# Patient Record
Sex: Female | Born: 1955 | Race: White | Hispanic: No | Smoking: Never smoker
Health system: Southern US, Community
[De-identification: ages and names within clinical notes are randomized; demographics above are authoritative.]

## PROBLEM LIST (undated history)

## (undated) DIAGNOSIS — S42201A Unspecified fracture of upper end of right humerus, initial encounter for closed fracture: Secondary | ICD-10-CM

## (undated) DIAGNOSIS — M1990 Unspecified osteoarthritis, unspecified site: Secondary | ICD-10-CM

## (undated) HISTORY — PX: TONSILLECTOMY: SUR1361

## (undated) HISTORY — PX: ROTATOR CUFF REPAIR: SHX139

---

## 2017-01-05 HISTORY — PX: ROTATOR CUFF REPAIR: SHX139

## 2019-09-25 ENCOUNTER — Emergency Department
Admission: EM | Admit: 2019-09-25 | Discharge: 2019-09-25 | Disposition: A | Discharge status: Home / Self Care | Payer: BC Managed Care – PPO | Attending: Emergency Medicine | Admitting: Emergency Medicine

## 2019-09-25 ENCOUNTER — Other Ambulatory Visit: Payer: Self-pay

## 2019-09-25 ENCOUNTER — Emergency Department: Payer: BC Managed Care – PPO

## 2019-09-25 DIAGNOSIS — Y92009 Unspecified place in unspecified non-institutional (private) residence as the place of occurrence of the external cause: Secondary | ICD-10-CM | POA: Diagnosis not present

## 2019-09-25 DIAGNOSIS — S4991XA Unspecified injury of right shoulder and upper arm, initial encounter: Secondary | ICD-10-CM | POA: Diagnosis present

## 2019-09-25 DIAGNOSIS — W19XXXA Unspecified fall, initial encounter: Secondary | ICD-10-CM | POA: Diagnosis not present

## 2019-09-25 DIAGNOSIS — S42351A Displaced comminuted fracture of shaft of humerus, right arm, initial encounter for closed fracture: Secondary | ICD-10-CM | POA: Diagnosis not present

## 2019-09-25 DIAGNOSIS — S42211A Unspecified displaced fracture of surgical neck of right humerus, initial encounter for closed fracture: Secondary | ICD-10-CM | POA: Diagnosis not present

## 2019-09-25 MED ORDER — MORPHINE SULFATE (PF) 4 MG/ML IV SOLN
4.0000 mg | Freq: Once | INTRAVENOUS | Status: AC
  Administered 2019-09-25: 4 mg via INTRAMUSCULAR
  Filled 2019-09-25: qty 1

## 2019-09-25 MED ORDER — CYCLOBENZAPRINE HCL 10 MG PO TABS
10.0000 mg | ORAL_TABLET | Freq: Once | ORAL | Status: AC
  Administered 2019-09-25: 10 mg via ORAL
  Filled 2019-09-25: qty 1

## 2019-09-25 MED ORDER — ONDANSETRON 4 MG PO TBDP
4.0000 mg | ORAL_TABLET | Freq: Once | ORAL | Status: AC
  Administered 2019-09-25: 4 mg via ORAL
  Filled 2019-09-25: qty 1

## 2019-09-25 MED ORDER — OXYCODONE-ACETAMINOPHEN 5-325 MG PO TABS: 1.0000 | ORAL_TABLET | Freq: Once | ORAL | Status: DC

## 2019-09-25 MED ORDER — OXYCODONE-ACETAMINOPHEN 5-325 MG PO TABS: 1.0000 | ORAL_TABLET | Freq: Three times a day (TID) | ORAL | 0 refills | Status: AC | PRN

## 2019-09-25 MED ORDER — OXYCODONE-ACETAMINOPHEN 5-325 MG PO TABS
1.0000 | ORAL_TABLET | Freq: Once | ORAL | Status: AC
  Administered 2019-09-25: 1 via ORAL
  Filled 2019-09-25: qty 1

## 2019-09-25 MED ORDER — CYCLOBENZAPRINE HCL 5 MG PO TABS: 5.0000 mg | ORAL_TABLET | Freq: Three times a day (TID) | ORAL | 0 refills | Status: DC | PRN

## 2019-09-25 MED ORDER — OXYCODONE HCL 5 MG PO TABS
5.0000 mg | ORAL_TABLET | Freq: Once | ORAL | Status: AC
  Administered 2019-09-25: 5 mg via ORAL
  Filled 2019-09-25: qty 1

## 2019-09-25 NOTE — Discharge Instructions (Signed)
Your exam and XR reveals a fracture of the humerus at the neck. Wear the brace until you are evaluated by Dr. Rosita Kea. Take the pain medicine as needed. Apply ice over the splint to reduce swelling.

## 2019-09-25 NOTE — ED Notes (Signed)
Ice applied to shoulder and elbow. Pt to xray.

## 2019-09-25 NOTE — ED Triage Notes (Signed)
Pt states she was knocked down by there dog and injured the right shoulder with obvious deformity. Pt also c/o Right upper leg pain but is able to ambulate to wheelchair. Denies hitting her head or other injures.

## 2019-09-25 NOTE — ED Provider Notes (Signed)
[facility] Emergency Department Provider Note ____________________________________________  Time seen: 1950  I have reviewed the triage vital signs and the nursing notes.  HISTORY  Chief Complaint  Fall  HPI Alicia Reese is a 64 y.o. female to the ED , accompanied by her adult daughter, for evaluation of injury sustained following mechanical fall.  Patient was knocked down by her dog, and fell injuring the right arm and shoulder.  She also reports some mild pain to the right upper leg.  She denies any head injury or loss of consciousness.  History reviewed. No pertinent past medical history.  There are no problems to display for this patient.   Past Surgical History:  Procedure Laterality Date  . ROTATOR CUFF REPAIR Right   . TONSILLECTOMY      Prior to Admission medications   Not on File    Allergies Aspirin  No family history on file.  Social History Social History   Tobacco Use  . Smoking status: Never Smoker  . Smokeless tobacco: Never Used  Substance Use Topics  . Alcohol use: Yes  . Drug use: Not Currently    Review of Systems  Constitutional: Negative for fever. Eyes: Negative for visual changes. ENT: Negative for sore throat. Cardiovascular: Negative for chest pain. Respiratory: Negative for shortness of breath. Musculoskeletal: Negative for back pain.  Reports right shoulder pain and disability as above. Skin: Negative for rash. Neurological: Negative for headaches, focal weakness or numbness. ____________________________________________  PHYSICAL EXAM:  VITAL SIGNS: ED Triage Vitals  Enc Vitals Group     BP 09/25/19 1956 (!) 179/81     Pulse Rate 09/25/19 1956 71     Resp 09/25/19 1956 20     Temp 09/25/19 1956 98.2 F (36.8 C)     Temp src --      SpO2 09/25/19 1956 96 %     Weight --      Height --      Head Circumference --      Peak Flow --      Pain Score 09/25/19 1903 10     Pain Loc --      Pain Edu? --       Excl. in GC? --     Constitutional: Alert and oriented. Well appearing and in no distress. Head: Normocephalic and atraumatic. Eyes: Conjunctivae are normal. Normal extraocular movements Neck: Supple. Normal ROM Cardiovascular: Normal rate, regular rhythm. Normal distal pulses. Respiratory: Normal respiratory effort. No wheezes/rales/rhonchi. Gastrointestinal: Soft and nontender. No distention. Musculoskeletal: Right shoulder with obvious deformity.  The right arm is held in a flexed abducted position and the shoulder.  Nontender with normal range of motion in all extremities.  Neurologic: Cranial nerves II to XII grossly intact.  Normal composite fist distally.. Normal speech and language. No gross focal neurologic deficits are appreciated. Skin:  Skin is warm, dry and intact. No rash noted. Psychiatric: Mood and affect are normal. Patient exhibits appropriate insight and judgment. ____________________________________________   RADIOLOGY  DG Right Shoulder IMPRESSION: 1. Comminuted fracture through the proximal right humerus. 2. Glenohumeral osteoarthritis. 3. Osteopenia.  DG Right Femur IMPRESSION: Negative.  DG Right Elbow IMPRESSION: No acute abnormality noted. ____________________________________________  PROCEDURES  Percocet 5-325 mg PO Zofran 4 mg ODT Morphine 4 mg IM Percocet 5-325 mg PO Oxycodone IR 5 mg PO  .Splint Application  Date/Time: 09/25/2019 10:15 PM Performed by: Lissa Hoard, PA-C Authorized by: Lissa Hoard, PA-C   Consent:  Consent obtained:  Verbal   Consent given by:  Patient   Risks discussed:  Pain Pre-procedure details:    Sensation:  Normal Procedure details:    Laterality:  Right   Location:  Shoulder   Shoulder:  R shoulder   Splint type:  Double sugar tong   Supplies:  Cotton padding, elastic bandage, Ortho-Glass and sling Post-procedure details:    Pain:  Improved   Sensation:  Normal   Patient  tolerance of procedure:  Tolerated well, no immediate complications  ____________________________________________  INITIAL IMPRESSION / ASSESSMENT AND PLAN / ED COURSE  Patient with ED evaluation and initial fracture care of a closed surgical neck fracture of the right humerus.  Patient sustained an injury after mechanical fall at home.  Images of the shoulder and elbow are reviewed.  She is placed in a double sugar tong to the upper arm and a sling is applied.  Pain is controlled at the time of this disposition.  Patient discharged to follow-up with Ortho as discussed. ----------------------------------------- 7:57 PM on 09/25/2019 ----------------------------------------- S/w Dr. Rosita Kea: he suggests splint/sling and outpatient follow-up in 1 week.    Alicia Reese was evaluated in Emergency Department on 09/25/2019 for the symptoms described in the history of present illness. She was evaluated in the context of the global COVID-19 pandemic, which necessitated consideration that the patient might be at risk for infection with the SARS-CoV-2 virus that causes COVID-19. Institutional protocols and algorithms that pertain to the evaluation of patients at risk for COVID-19 are in a state of rapid change based on information released by regulatory bodies including the CDC and federal and state organizations. These policies and algorithms were followed during the patient's care in the ED.  I reviewed the patient's prescription history over the last 12 months in the multi-state controlled substances database(s) that includes [place], [place], [place], [place], [place], [place], [place], [place], [place], [place], [place], [place], [place], and [place].  Results were notable for no RX history. ____________________________________________  FINAL CLINICAL IMPRESSION(S) / ED DIAGNOSES  Final diagnoses:  Fall in home, initial encounter  Closed displaced comminuted fracture  of shaft of right humerus, initial encounter  Closed displaced fracture of surgical neck of right humerus, unspecified fracture morphology, initial encounter      Kebron Pulse, Charlesetta Ivory, PA-C 09/25/19 2221    Merwyn Katos, MD 09/29/19 1154

## 2019-09-25 NOTE — ED Notes (Signed)
Pt states tripping over a dog. Pt states she did not hit her head or have LOC

## 2019-09-28 ENCOUNTER — Other Ambulatory Visit: Payer: Self-pay | Admitting: Sports Medicine

## 2019-09-28 ENCOUNTER — Ambulatory Visit
Admission: RE | Admit: 2019-09-28 | Discharge: 2019-09-28 | Disposition: A | Discharge status: Home / Self Care | Payer: BC Managed Care – PPO | Source: Ambulatory Visit | Attending: Sports Medicine | Admitting: Sports Medicine

## 2019-09-28 DIAGNOSIS — M898X2 Other specified disorders of bone, upper arm: Secondary | ICD-10-CM

## 2019-09-29 ENCOUNTER — Other Ambulatory Visit: Payer: Self-pay

## 2019-09-29 ENCOUNTER — Encounter (HOSPITAL_BASED_OUTPATIENT_CLINIC_OR_DEPARTMENT_OTHER): Payer: Self-pay | Admitting: Orthopaedic Surgery

## 2019-09-30 ENCOUNTER — Other Ambulatory Visit (HOSPITAL_COMMUNITY)
Admission: RE | Admit: 2019-09-30 | Discharge: 2019-09-30 | Disposition: A | Discharge status: Home / Self Care | Payer: BC Managed Care – PPO | Source: Ambulatory Visit | Attending: Orthopaedic Surgery | Admitting: Orthopaedic Surgery

## 2019-09-30 DIAGNOSIS — Z01812 Encounter for preprocedural laboratory examination: Secondary | ICD-10-CM | POA: Insufficient documentation

## 2019-09-30 DIAGNOSIS — Z20822 Contact with and (suspected) exposure to covid-19: Secondary | ICD-10-CM | POA: Diagnosis not present

## 2019-09-30 LAB — SARS CORONAVIRUS 2 (TAT 6-24 HRS): SARS Coronavirus 2: NEGATIVE

## 2019-10-02 ENCOUNTER — Encounter (HOSPITAL_BASED_OUTPATIENT_CLINIC_OR_DEPARTMENT_OTHER)
Admission: RE | Admit: 2019-10-02 | Discharge: 2019-10-02 | Disposition: A | Discharge status: Home / Self Care | Payer: BC Managed Care – PPO | Source: Ambulatory Visit | Attending: Orthopaedic Surgery | Admitting: Orthopaedic Surgery

## 2019-10-02 DIAGNOSIS — Z886 Allergy status to analgesic agent status: Secondary | ICD-10-CM | POA: Diagnosis not present

## 2019-10-02 DIAGNOSIS — Z01812 Encounter for preprocedural laboratory examination: Secondary | ICD-10-CM | POA: Diagnosis not present

## 2019-10-02 DIAGNOSIS — W1789XA Other fall from one level to another, initial encounter: Secondary | ICD-10-CM | POA: Diagnosis not present

## 2019-10-02 DIAGNOSIS — M19011 Primary osteoarthritis, right shoulder: Secondary | ICD-10-CM | POA: Diagnosis present

## 2019-10-02 DIAGNOSIS — S42291A Other displaced fracture of upper end of right humerus, initial encounter for closed fracture: Secondary | ICD-10-CM | POA: Diagnosis not present

## 2019-10-02 DIAGNOSIS — Z9889 Other specified postprocedural states: Secondary | ICD-10-CM | POA: Diagnosis not present

## 2019-10-02 DIAGNOSIS — M19032 Primary osteoarthritis, left wrist: Secondary | ICD-10-CM | POA: Diagnosis not present

## 2019-10-02 DIAGNOSIS — Y939 Activity, unspecified: Secondary | ICD-10-CM | POA: Diagnosis not present

## 2019-10-02 DIAGNOSIS — M19031 Primary osteoarthritis, right wrist: Secondary | ICD-10-CM | POA: Diagnosis not present

## 2019-10-02 DIAGNOSIS — M1990 Unspecified osteoarthritis, unspecified site: Secondary | ICD-10-CM | POA: Diagnosis not present

## 2019-10-02 LAB — SURGICAL PCR SCREEN
MRSA, PCR: NEGATIVE
Staphylococcus aureus: POSITIVE — AB

## 2019-10-02 NOTE — Progress Notes (Signed)
PCR swab - positive for Staph, notified Sherrie at Dr. Austin Miles office.

## 2019-10-02 NOTE — Progress Notes (Signed)
      Enhanced Recovery after Surgery for Orthopedics Enhanced Recovery after Surgery is a protocol used to improve the stress on your body and your recovery after surgery.  Patient Instructions  . The night before surgery:  o No food after midnight. ONLY clear liquids after midnight  . The day of surgery (if you do NOT have diabetes):  o Drink ONE (1) Pre-Surgery Clear Ensure as directed.   o This drink was given to you during your hospital  pre-op appointment visit. o The pre-op nurse will instruct you on the time to drink the  Pre-Surgery Ensure depending on your surgery time. o Finish the drink at the designated time by the pre-op nurse.  o Nothing else to drink after completing the  Pre-Surgery Clear Ensure.  . The day of surgery (if you have diabetes): o Drink ONE (1) Gatorade 2 (G2) as directed. o This drink was given to you during your hospital  pre-op appointment visit.  o The pre-op nurse will instruct you on the time to drink the   Gatorade 2 (G2) depending on your surgery time. o Color of the Gatorade may vary. Red is not allowed. o Nothing else to drink after completing the  Gatorade 2 (G2).         If you have questions, please contact your surgeon's office. Surgical soap given with instructions, pt verbalized understanding. Benzoyl peroxide gel given with instructions, pt verbalized understanding. 

## 2019-10-03 NOTE — H&P (Signed)
PREOPERATIVE H&P  Chief Complaint: RIGHT PROXIMAL HUMERUS FRACTURE  HPI: Alicia Reese is a 64 y.o. female who is scheduled for Procedure(s): RIGHT REVERSE TOTAL  SHOULDER ARTHROPLASTY.   Patient is a healthy 64 year old female who just moved from [place]. She had a fall on Monday night, September 20.   She fell off a porch.  She is right-hand dominant at baseline.  She already had previous rotator cuff surgery in 2019 and had some arthritis in the shoulder that she had known about. She has had trouble with her right shoulder prior to having this fall.   Her symptoms are rated as moderate to severe, and have been worsening.  This is significantly impairing activities of daily living.    Please see clinic note for further details on this patient's care.    She has elected for surgical management.   Past Medical History:  Diagnosis Date  . Arthritis    back, wrists, shoulders  . Closed fracture of right proximal humerus    Past Surgical History:  Procedure Laterality Date  . ROTATOR CUFF REPAIR Right 2019  . TONSILLECTOMY     Social History   Socioeconomic History  . Marital status: Widowed    Spouse name: Not on file  . Number of children: Not on file  . Years of education: Not on file  . Highest education level: Not on file  Occupational History  . Not on file  Tobacco Use  . Smoking status: Never Smoker  . Smokeless tobacco: Never Used  Substance and Sexual Activity  . Alcohol use: Yes    Comment: social  . Drug use: Never  . Sexual activity: Not on file  Other Topics Concern  . Not on file  Social History Narrative  . Not on file   Social Determinants of Health   Financial Resource Strain:   . Difficulty of Paying Living Expenses: Not on file  Food Insecurity:   . Worried About Programme researcher, broadcasting/film/video in the Last Year: Not on file  . Ran Out of Food in the Last Year: Not on file  Transportation Needs:   . Lack of Transportation (Medical): Not on file  .  Lack of Transportation (Non-Medical): Not on file  Physical Activity:   . Days of Exercise per Week: Not on file  . Minutes of Exercise per Session: Not on file  Stress:   . Feeling of Stress : Not on file  Social Connections:   . Frequency of Communication with Friends and Family: Not on file  . Frequency of Social Gatherings with Friends and Family: Not on file  . Attends Religious Services: Not on file  . Active Member of Clubs or Organizations: Not on file  . Attends Banker Meetings: Not on file  . Marital Status: Not on file   History reviewed. No pertinent family history. Allergies  Allergen Reactions  . Aspirin Anaphylaxis  . Naproxen Anaphylaxis  . Nsaids Anaphylaxis   Prior to Admission medications   Medication Sig Start Date End Date Taking? Authorizing Provider  cyclobenzaprine (FLEXERIL) 5 MG tablet Take 1 tablet (5 mg total) by mouth 3 (three) times daily as needed. 09/25/19  Yes Menshew, Charlesetta Ivory, PA-C    ROS: All other systems have been reviewed and were otherwise negative with the exception of those mentioned in the HPI and as above.  Physical Exam: General: Alert, no acute distress Cardiovascular: No pedal edema Respiratory: No cyanosis, no  use of accessory musculature GI: No organomegaly, abdomen is soft and non-tender Skin: No lesions in the area of chief complaint Neurologic: Sensation intact distally Psychiatric: Patient is competent for consent with normal mood and affect Lymphatic: No axillary or cervical lymphadenopathy  MUSCULOSKELETAL:  Examination of the right arm demonstrates exquisite pain with any attempts at range of motion.  Axillary nerve sensation appears to be altered compared to the contralateral side.  We are not able to get any firing of her axillary nerve secondary to pain.  Distal motor and sensory functions are otherwise intact.  Imaging: X-rays reviewed demonstrate a displaced two-part proximal humerus fracture in  the setting of moderate to severe glenohumeral arthritis.  Assessment: RIGHT PROXIMAL HUMERUS FRACTURE Previous cuff repair with arthritis and a proximal humerus fracture.  Also has questionable axillary nerve function currently.  Plan: Plan for Procedure(s): RIGHT REVERSE TOTAL  SHOULDER ARTHROPLASTY  Dr. Everardo Pacific and the patient talked about the possibility of doing a reverse total shoulder arthroplasty in the setting of her previous cuff failure and her arthritis.  She elected to proceed with this.  We are worried about her axillary nerve function.  We will continue to monitor this.  The risks benefits and alternatives were discussed with the patient including but not limited to the risks of nonoperative treatment, versus surgical intervention including infection, bleeding, nerve injury,  blood clots, cardiopulmonary complications, morbidity, mortality, among others, and they were willing to proceed.   We additionally specifically discussed risks of axillary nerve injury, infection, periprosthetic fracture, continued pain and longevity of implants prior to beginning procedure.    Patient will be closely monitored in PACU for medical stabilization and pain control. If found stable in PACU, patient may be discharged home with outpatient follow-up. If any concerns regarding patient's stabilization patient will be admitted for observation after surgery. The patient is planning to be discharged home with outpatient PT.   The patient acknowledged the explanation, agreed to proceed with the plan and consent was signed.   Operative Plan: Right reverse total shoulder arthroplasty  Discharge Medications: Standard DVT Prophylaxis: Aspirin Physical Therapy: Outpatient PT Special Discharge needs: Sling   Vernetta Honey, PA-C  10/03/2019 4:10 PM

## 2019-10-04 ENCOUNTER — Ambulatory Visit (HOSPITAL_COMMUNITY): Payer: BC Managed Care – PPO

## 2019-10-04 ENCOUNTER — Ambulatory Visit (HOSPITAL_BASED_OUTPATIENT_CLINIC_OR_DEPARTMENT_OTHER)
Admission: RE | Admit: 2019-10-04 | Discharge: 2019-10-04 | Disposition: A | Discharge status: Home / Self Care | Payer: BC Managed Care – PPO | Attending: Orthopaedic Surgery | Admitting: Orthopaedic Surgery

## 2019-10-04 ENCOUNTER — Ambulatory Visit (HOSPITAL_BASED_OUTPATIENT_CLINIC_OR_DEPARTMENT_OTHER): Payer: BC Managed Care – PPO | Admitting: Anesthesiology

## 2019-10-04 ENCOUNTER — Other Ambulatory Visit: Payer: Self-pay

## 2019-10-04 ENCOUNTER — Encounter (HOSPITAL_BASED_OUTPATIENT_CLINIC_OR_DEPARTMENT_OTHER)
Admission: RE | Disposition: A | Discharge status: Home / Self Care | Payer: Self-pay | Source: Home / Self Care | Attending: Orthopaedic Surgery

## 2019-10-04 ENCOUNTER — Encounter (HOSPITAL_BASED_OUTPATIENT_CLINIC_OR_DEPARTMENT_OTHER): Payer: Self-pay | Admitting: Orthopaedic Surgery

## 2019-10-04 DIAGNOSIS — S42291A Other displaced fracture of upper end of right humerus, initial encounter for closed fracture: Secondary | ICD-10-CM | POA: Diagnosis not present

## 2019-10-04 DIAGNOSIS — M19011 Primary osteoarthritis, right shoulder: Secondary | ICD-10-CM | POA: Insufficient documentation

## 2019-10-04 DIAGNOSIS — M19032 Primary osteoarthritis, left wrist: Secondary | ICD-10-CM | POA: Insufficient documentation

## 2019-10-04 DIAGNOSIS — M19031 Primary osteoarthritis, right wrist: Secondary | ICD-10-CM | POA: Insufficient documentation

## 2019-10-04 DIAGNOSIS — M1990 Unspecified osteoarthritis, unspecified site: Secondary | ICD-10-CM | POA: Insufficient documentation

## 2019-10-04 DIAGNOSIS — Z9889 Other specified postprocedural states: Secondary | ICD-10-CM | POA: Insufficient documentation

## 2019-10-04 DIAGNOSIS — Y939 Activity, unspecified: Secondary | ICD-10-CM | POA: Insufficient documentation

## 2019-10-04 DIAGNOSIS — Z09 Encounter for follow-up examination after completed treatment for conditions other than malignant neoplasm: Secondary | ICD-10-CM

## 2019-10-04 DIAGNOSIS — W1789XA Other fall from one level to another, initial encounter: Secondary | ICD-10-CM | POA: Insufficient documentation

## 2019-10-04 DIAGNOSIS — Z886 Allergy status to analgesic agent status: Secondary | ICD-10-CM | POA: Insufficient documentation

## 2019-10-04 HISTORY — DX: Unspecified osteoarthritis, unspecified site: M19.90

## 2019-10-04 HISTORY — PX: REVERSE SHOULDER ARTHROPLASTY: SHX5054

## 2019-10-04 HISTORY — DX: Unspecified fracture of upper end of right humerus, initial encounter for closed fracture: S42.201A

## 2019-10-04 SURGERY — ARTHROPLASTY, SHOULDER, TOTAL, REVERSE
Anesthesia: General | Site: Shoulder | Laterality: Right

## 2019-10-04 MED ORDER — FENTANYL CITRATE (PF) 100 MCG/2ML IJ SOLN
INTRAMUSCULAR | Status: AC
  Filled 2019-10-04: qty 2

## 2019-10-04 MED ORDER — MEPERIDINE HCL 25 MG/ML IJ SOLN: 6.2500 mg | INTRAMUSCULAR | Status: DC | PRN

## 2019-10-04 MED ORDER — ROCURONIUM BROMIDE 10 MG/ML (PF) SYRINGE
PREFILLED_SYRINGE | INTRAVENOUS | Status: DC | PRN
  Administered 2019-10-04: 60 mg via INTRAVENOUS

## 2019-10-04 MED ORDER — SODIUM CHLORIDE 0.9 % IV SOLN
INTRAVENOUS | Status: AC | PRN
  Administered 2019-10-04: 1000 mL

## 2019-10-04 MED ORDER — TRANEXAMIC ACID-NACL 1000-0.7 MG/100ML-% IV SOLN
INTRAVENOUS | Status: AC
  Filled 2019-10-04: qty 100

## 2019-10-04 MED ORDER — PHENYLEPHRINE 40 MCG/ML (10ML) SYRINGE FOR IV PUSH (FOR BLOOD PRESSURE SUPPORT)
PREFILLED_SYRINGE | INTRAVENOUS | Status: DC | PRN
  Administered 2019-10-04: 80 ug via INTRAVENOUS

## 2019-10-04 MED ORDER — FENTANYL CITRATE (PF) 100 MCG/2ML IJ SOLN
100.0000 ug | Freq: Once | INTRAMUSCULAR | Status: AC
  Administered 2019-10-04: 100 ug via INTRAVENOUS

## 2019-10-04 MED ORDER — TRANEXAMIC ACID-NACL 1000-0.7 MG/100ML-% IV SOLN
1000.0000 mg | INTRAVENOUS | Status: AC
  Administered 2019-10-04: 1000 mg via INTRAVENOUS

## 2019-10-04 MED ORDER — CEFAZOLIN SODIUM-DEXTROSE 2-4 GM/100ML-% IV SOLN
2.0000 g | INTRAVENOUS | Status: AC
  Administered 2019-10-04: 2 g via INTRAVENOUS

## 2019-10-04 MED ORDER — LACTATED RINGERS IV SOLN: INTRAVENOUS | Status: DC

## 2019-10-04 MED ORDER — ONDANSETRON HCL 4 MG/2ML IJ SOLN: 4.0000 mg | Freq: Once | INTRAMUSCULAR | Status: DC | PRN

## 2019-10-04 MED ORDER — PROPOFOL 10 MG/ML IV BOLUS
INTRAVENOUS | Status: DC | PRN
  Administered 2019-10-04: 180 mg via INTRAVENOUS

## 2019-10-04 MED ORDER — FENTANYL CITRATE (PF) 100 MCG/2ML IJ SOLN: 25.0000 ug | INTRAMUSCULAR | Status: DC | PRN

## 2019-10-04 MED ORDER — BUPIVACAINE-EPINEPHRINE (PF) 0.5% -1:200000 IJ SOLN
INTRAMUSCULAR | Status: DC | PRN
  Administered 2019-10-04: 15 mL via PERINEURAL

## 2019-10-04 MED ORDER — SUGAMMADEX SODIUM 200 MG/2ML IV SOLN
INTRAVENOUS | Status: DC | PRN
  Administered 2019-10-04: 200 mg via INTRAVENOUS

## 2019-10-04 MED ORDER — VANCOMYCIN HCL 1000 MG IV SOLR
INTRAVENOUS | Status: DC | PRN
  Administered 2019-10-04: 1000 mg

## 2019-10-04 MED ORDER — LIDOCAINE 2% (20 MG/ML) 5 ML SYRINGE
INTRAMUSCULAR | Status: DC | PRN
  Administered 2019-10-04: 60 mg via INTRAVENOUS

## 2019-10-04 MED ORDER — FENTANYL CITRATE (PF) 250 MCG/5ML IJ SOLN
INTRAMUSCULAR | Status: DC | PRN
Start: 2019-10-04 — End: 2019-10-04
  Administered 2019-10-04 (×2): 50 ug via INTRAVENOUS

## 2019-10-04 MED ORDER — RIVAROXABAN 10 MG PO TABS: 10.0000 mg | ORAL_TABLET | Freq: Every day | ORAL | 0 refills | Status: AC

## 2019-10-04 MED ORDER — ACETAMINOPHEN 325 MG PO TABS: 325.0000 mg | ORAL_TABLET | ORAL | Status: DC | PRN

## 2019-10-04 MED ORDER — CEFAZOLIN SODIUM-DEXTROSE 2-4 GM/100ML-% IV SOLN
INTRAVENOUS | Status: AC
  Filled 2019-10-04: qty 100

## 2019-10-04 MED ORDER — DEXAMETHASONE SODIUM PHOSPHATE 10 MG/ML IJ SOLN
INTRAMUSCULAR | Status: DC | PRN
  Administered 2019-10-04: 5 mg via INTRAVENOUS

## 2019-10-04 MED ORDER — OXYCODONE HCL 5 MG/5ML PO SOLN: 5.0000 mg | Freq: Once | ORAL | Status: DC | PRN

## 2019-10-04 MED ORDER — ACETAMINOPHEN 500 MG PO TABS: 1000.0000 mg | ORAL_TABLET | Freq: Three times a day (TID) | ORAL | 0 refills | Status: AC

## 2019-10-04 MED ORDER — OXYCODONE HCL 5 MG PO TABS: ORAL_TABLET | ORAL | 0 refills | Status: AC

## 2019-10-04 MED ORDER — MIDAZOLAM HCL 2 MG/2ML IJ SOLN
2.0000 mg | Freq: Once | INTRAMUSCULAR | Status: AC
  Administered 2019-10-04: 2 mg via INTRAVENOUS

## 2019-10-04 MED ORDER — ACETAMINOPHEN 160 MG/5ML PO SOLN: 325.0000 mg | ORAL | Status: DC | PRN

## 2019-10-04 MED ORDER — OXYCODONE HCL 5 MG PO TABS: 5.0000 mg | ORAL_TABLET | Freq: Once | ORAL | Status: DC | PRN

## 2019-10-04 MED ORDER — ONDANSETRON HCL 4 MG/2ML IJ SOLN
INTRAMUSCULAR | Status: DC | PRN
  Administered 2019-10-04: 4 mg via INTRAVENOUS

## 2019-10-04 MED ORDER — GABAPENTIN 100 MG PO CAPS: 100.0000 mg | ORAL_CAPSULE | Freq: Two times a day (BID) | ORAL | 0 refills | Status: AC

## 2019-10-04 MED ORDER — BUPIVACAINE LIPOSOME 1.3 % IJ SUSP
INTRAMUSCULAR | Status: DC | PRN
  Administered 2019-10-04: 10 mL via PERINEURAL

## 2019-10-04 MED ORDER — MIDAZOLAM HCL 2 MG/2ML IJ SOLN
INTRAMUSCULAR | Status: AC
  Filled 2019-10-04: qty 2

## 2019-10-04 MED ORDER — ONDANSETRON HCL 4 MG PO TABS: 4.0000 mg | ORAL_TABLET | Freq: Three times a day (TID) | ORAL | 1 refills | Status: AC | PRN

## 2019-10-04 SURGICAL SUPPLY — 89 items
AID PSTN UNV HD RSTRNT DISP (MISCELLANEOUS) ×1
APL PRP STRL LF DISP 70% ISPRP (MISCELLANEOUS) ×1
BASEPLATE GLENOID RSA 3X25 0D (Shoulder) ×3 IMPLANT
BIT DRILL 3.2 PERIPHERAL SCREW (BIT) ×3 IMPLANT
BLADE HEX COATED 2.75 (ELECTRODE) ×3 IMPLANT
BLADE SAW SAG 73X25 THK (BLADE) ×2
BLADE SAW SGTL 73X25 THK (BLADE) ×1 IMPLANT
BLADE SURG 10 STRL SS (BLADE) ×3 IMPLANT
BLADE SURG 15 STRL LF DISP TIS (BLADE) ×1 IMPLANT
BLADE SURG 15 STRL SS (BLADE) ×3
BNDG COHESIVE 4X5 TAN STRL (GAUZE/BANDAGES/DRESSINGS) IMPLANT
BODY PROXIMAL PTC 13X132.5 (Joint) ×1 IMPLANT
BSPLAT GLND +3 25 (Shoulder) ×1 IMPLANT
CAP LOCKING COCR (Cap) ×3 IMPLANT
CHLORAPREP W/TINT 26 (MISCELLANEOUS) ×3 IMPLANT
CLOSURE STERI-STRIP 1/2X4 (GAUZE/BANDAGES/DRESSINGS) ×1
CLSR STERI-STRIP ANTIMIC 1/2X4 (GAUZE/BANDAGES/DRESSINGS) ×2 IMPLANT
COOLER ICEMAN CLASSIC (MISCELLANEOUS) ×3 IMPLANT
COVER BACK TABLE 60X90IN (DRAPES) ×3 IMPLANT
COVER MAYO STAND STRL (DRAPES) ×3 IMPLANT
COVER WAND RF STERILE (DRAPES) IMPLANT
DECANTER SPIKE VIAL GLASS SM (MISCELLANEOUS) IMPLANT
DRAPE IMP U-DRAPE 54X76 (DRAPES) ×3 IMPLANT
DRAPE INCISE IOBAN 66X45 STRL (DRAPES) ×6 IMPLANT
DRAPE U-SHAPE 76X120 STRL (DRAPES) ×6 IMPLANT
DRSG AQUACEL AG ADV 3.5X 6 (GAUZE/BANDAGES/DRESSINGS) ×3 IMPLANT
ELECT BLADE 4.0 EZ CLEAN MEGAD (MISCELLANEOUS) ×3
ELECT REM PT RETURN 9FT ADLT (ELECTROSURGICAL) ×3
ELECTRODE BLDE 4.0 EZ CLN MEGD (MISCELLANEOUS) ×1 IMPLANT
ELECTRODE REM PT RTRN 9FT ADLT (ELECTROSURGICAL) ×1 IMPLANT
GAUZE XEROFORM 1X8 LF (GAUZE/BANDAGES/DRESSINGS) IMPLANT
GLENOSPHERE REV SHOULDER 36 (Joint) ×3 IMPLANT
GLOVE BIO SURGEON STRL SZ 6 (GLOVE) ×3 IMPLANT
GLOVE BIO SURGEON STRL SZ 6.5 (GLOVE) ×4 IMPLANT
GLOVE BIO SURGEONS STRL SZ 6.5 (GLOVE) ×2
GLOVE BIOGEL M 6.5 STRL (GLOVE) ×3 IMPLANT
GLOVE BIOGEL PI IND STRL 6.5 (GLOVE) ×3 IMPLANT
GLOVE BIOGEL PI IND STRL 7.0 (GLOVE) ×1 IMPLANT
GLOVE BIOGEL PI IND STRL 8 (GLOVE) ×1 IMPLANT
GLOVE BIOGEL PI INDICATOR 6.5 (GLOVE) ×6
GLOVE BIOGEL PI INDICATOR 7.0 (GLOVE) ×2
GLOVE BIOGEL PI INDICATOR 8 (GLOVE) ×2
GLOVE ECLIPSE 8.0 STRL XLNG CF (GLOVE) ×6 IMPLANT
GOWN STRL REUS W/ TWL LRG LVL3 (GOWN DISPOSABLE) ×2 IMPLANT
GOWN STRL REUS W/TWL LRG LVL3 (GOWN DISPOSABLE) ×6
GOWN STRL REUS W/TWL XL LVL3 (GOWN DISPOSABLE) ×3 IMPLANT
GUIDEWIRE GLENOID 2.5X220 (WIRE) ×3 IMPLANT
HANDPIECE INTERPULSE COAX TIP (DISPOSABLE) ×3
IMPL REVERSE SHOULDER 0X3.5 (Shoulder) ×1 IMPLANT
IMPLANT REVERSE SHOULDER 0X3.5 (Shoulder) ×3 IMPLANT
INSERT HUMERAL 36X6MM 12.5DEG (Insert) ×3 IMPLANT
KIT STABILIZATION SHOULDER (MISCELLANEOUS) ×3 IMPLANT
MANIFOLD NEPTUNE II (INSTRUMENTS) ×3 IMPLANT
NDL SUT 6 .5 CRC .975X.05 MAYO (NEEDLE) ×1 IMPLANT
NEEDLE MAYO TAPER (NEEDLE) ×3
NEEDLE MAYO TROCAR (NEEDLE) ×3 IMPLANT
PACK ARTHROSCOPY DSU (CUSTOM PROCEDURE TRAY) ×3 IMPLANT
PACK BASIN DAY SURGERY FS (CUSTOM PROCEDURE TRAY) ×3 IMPLANT
PAD COLD SHLDR WRAP-ON (PAD) ×3 IMPLANT
PAD ORTHO SHOULDER 7X19 LRG (SOFTGOODS) ×3 IMPLANT
PENCIL SMOKE EVACUATOR (MISCELLANEOUS) ×3 IMPLANT
PROXIMAL BODY PTC 13X132.5 (Joint) ×3 IMPLANT
PRT COAT DISTL STEM 13 SHOUL (Miscellaneous) ×3 IMPLANT
RESTRAINT HEAD UNIVERSAL NS (MISCELLANEOUS) ×3 IMPLANT
SCREW BONE 6.5X40 SM (Screw) ×3 IMPLANT
SCREW PERIPHERAL 30 (Screw) ×3 IMPLANT
SCREW PERIPHERAL 5.0X34 (Screw) ×3 IMPLANT
SCREW SHLD ASSEMBLY AEQ 20 (Spacer) ×2 IMPLANT
SET HNDPC FAN SPRY TIP SCT (DISPOSABLE) ×1 IMPLANT
SHEET MEDIUM DRAPE 40X70 STRL (DRAPES) ×3 IMPLANT
SLEEVE SCD COMPRESS KNEE MED (MISCELLANEOUS) ×3 IMPLANT
SPACER AEQUALIS 13X20 (Spacer) ×1 IMPLANT
SPACER REV AEQUALIS 11 (Spacer) ×1 IMPLANT
SPACER SHLD CMT AEQ 13X20 (Spacer) ×2 IMPLANT
SPONGE LAP 18X18 RF (DISPOSABLE) ×3 IMPLANT
STEM SHLDR DIST PRTLY COATD 13 (Miscellaneous) ×1 IMPLANT
SUT ETHIBOND 2 V 37 (SUTURE) ×3 IMPLANT
SUT FIBERWIRE #5 38 CONV NDL (SUTURE) ×18
SUT MNCRL AB 4-0 PS2 18 (SUTURE) ×3 IMPLANT
SUT VIC AB 0 CT1 27 (SUTURE)
SUT VIC AB 0 CT1 27XCR 8 STRN (SUTURE) IMPLANT
SUT VIC AB 3-0 SH 27 (SUTURE) ×6
SUT VIC AB 3-0 SH 27X BRD (SUTURE) ×2 IMPLANT
SUTURE FIBERWR #5 38 CONV NDL (SUTURE) ×6 IMPLANT
SYR BULB IRRIG 60ML STRL (SYRINGE) IMPLANT
SYR TOOMEY IRRIG 70ML (MISCELLANEOUS) ×3
SYRINGE TOOMEY IRRIG 70ML (MISCELLANEOUS) ×1 IMPLANT
TOWEL GREEN STERILE FF (TOWEL DISPOSABLE) ×9 IMPLANT
TUBE SUCTION HIGH CAP CLEAR NV (SUCTIONS) ×3 IMPLANT

## 2019-10-04 NOTE — Progress Notes (Signed)
Assisted Dr. Oddono with right, ultrasound guided, interscalene  block. Side rails up, monitors on throughout procedure. See vital signs in flow sheet. Tolerated Procedure well. 

## 2019-10-04 NOTE — Op Note (Signed)
Orthopaedic Surgery Operative Note (CSN: [id number])  Alicia Reese  11/30/1955 Date of Surgery: 10/04/2019   Diagnoses:  Right proximal humerus fracture in the setting of previous cuff repair and signs of cuff tear arthropathy  Procedure: Right reverse total Shoulder Arthroplasty   Operative Finding Successful completion of planned procedure.  Patient had a superior cuff retear but anchors and fiber tape in place.  She had significant superior wear on her humeral head consistent with early proximal migration.  Glenoid was relatively intact.  We had robust fixation of the glenoid and used a longstem diaphyseal fit implant to get past her fracture site which encompassed the calcar.  Axillary nerve tug test was intact during and at the end of the case.  She did have a preoperative axillary nerve sensory abnormality at the very least and we were not able to get her to fire her deltoid.  She is a high risk of long-term issues as well as dislocation secondary to this.  Post-operative plan: The patient will be NWB in sling.  The patient will be will be discharged from PACU if continues to be stable as was plan prior to surgery.  DVT prophylaxis Xarelto 10 mg/day.  Pain control with PRN pain medication preferring oral medicines.  Follow up plan will be scheduled in approximately 7 days for incision check and XR.  Physical therapy to start after first visit.  Implants: Tornier 13 revive stem, 20 spacer, 13 proximal body, 0 high offset tray with 6 poly-.  36 glenosphere with a +3 baseplate size 25 mm and a 40 center screw.  Post-Op Diagnosis: Same Surgeons:Primary: Hiram Gash, MD Assistants:Caroline McBane PA-C Location: [facility] OR ROOM 5 Anesthesia: General with Exparel Interscalene Antibiotics: Ancef 2g preop, Vancomycin $RemoveBeforeDEI'1000mg'[id number]$  locally Tourniquet time: None Estimated Blood Loss: 130 Complications: None Specimens: None Implants: Implant Name Type Inv. Item Serial No. Manufacturer Lot No. LRB No. Used  Action  Lateralized Baseplate    [employer] [id number] Right 1 Implanted  GLENOSPHERE REV SHOULDER 36 - [id number] Joint GLENOSPHERE REV SHOULDER 36  [employer] [id number] Right 1 Implanted  SCREW BONE 6.5X40 SM - [id number] Screw SCREW BONE 6.5X40 SM  [employer] STERILIZED ON SET Right 1 Implanted  SCREW PERIPHERAL 5.0X34 - [id number] Screw SCREW PERIPHERAL 5.0X34  [employer] STERILIZED ON SET Right 1 Implanted  SCREW PERIPHERAL 30 - [id number] Screw SCREW PERIPHERAL 30  [employer] STERILIZED ON SET Right 1 Implanted  IMPLANT REVERSE SHOULDER 0X3.5 - [id number] Shoulder IMPLANT REVERSE SHOULDER 0X3.[place] [id number] Right 1 Implanted  INSERT HUMERAL 36X6MM 12.5DEG - [id number] Insert INSERT HUMERAL 36X6MM 12.[employer] [id number] Right 1 Implanted  Spacer    [employer] [id number] Right 1 Implanted  Locking Cap    [employer] [id number] Right 1 Implanted  Assembly Screw    [employer] [id number] Right 1 Implanted  PTC Proximal Body    [employer] [id number] Right 1 Implanted  PTC Coated Stem    [employer] [id number] Right 1 Implanted    Indications for Surgery:   Alicia Reese is a 64 y.o. 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  We talked about the possibility of fixing the fracture and coming back at a later date to do a replacement versus doing an arthroplasty now.  She did have a preoperative axillary nerve sensory deficit and possible palsy with inability to fire deltoid.  Benefits and risks of operative and nonoperative management were discussed prior to surgery with patient/guardian(s) and informed consent form was completed.  Infection and need for further surgery were discussed as was prosthetic stability and cuff issues.  We additionally specifically discussed risks of axillary nerve injury, infection, periprosthetic fracture, continued pain and  longevity of implants prior to beginning procedure.      Procedure:   The patient was identified in the preoperative holding area where the surgical site was marked. Block placed by anesthesia with exparel.  The patient was taken to the OR where a procedural timeout was called and the above noted anesthesia was induced.  The patient was positioned beachchair on allen table with spider arm positioner.  Preoperative antibiotics were dosed.  The patient's right shoulder was prepped and draped in the usual sterile fashion.  A second preoperative timeout was called.       Standard deltopectoral approach was performed with a #10 blade. We dissected down to the subcutaneous tissues and the cephalic vein was taken laterally with the deltoid. Clavipectoral fascia was incised in line with the incision. Deep retractors were placed. The long of the biceps tendon was identified and there was significant tenosynovitis present.  Tenodesis was performed to the pectoralis tendon with #2 Ethibond. The remaining biceps was followed up into the rotator interval where it was released.    We used the bicipital groove as a landmark for the lesser and greater tuberosity fragments.  We were able to mobilize the lesser tuberosity fragment and placed stay sutures in the bone tendon junction to help with mobilization.  This point we were able to identify the  greater tuberosity fragment and 4 #5  FiberWire sutures were used to place into this for eventual repair of the tuberosities.  Once these were both mobilized we took care to identify the shaft fragment as well as the head fragment.  We carefully identified the head fragment were able to manually remove it.  At this point the axillary nerve was found and palpated and with a tug test noted to be intact.  Protected throughout the remainder of the case with blunt retractors.    We then released the SGHL with bovie cautery prior to placing a curved mayo at the junction of the  anterior glenoid well above the axillary nerve and bluntly dissecting the subscapularis from the capsule.  We then carefully protected the axillary nerve as we gently released the inferior capsule to fully mobilize the subscapularis.  An anterior deltoid retractor was then placed as well as a small Hohmann retractor superiorly.   The glenoid was relatively preserved as we would expect in this fracture patient.  The remaining labrum was removed circumferentially taking great care not to disrupt the posterior capsule.    The glenoid drill guide was placed and used to drill a guide pin in the center, inferior position. The glenoid face was then reamed concentrically over the guide wire. The center hole was drilled over the guidepin in a near anatomic angle of version. Next the glenoid vault was drilled back to a depth of 40 mm.  We tapped and then placed a 5m size baseplate with 3 lateralization was selected with a 6.5 mm x 433mlength central screw.  The base plate was screwed into the glenoid vault obtaining secure fixation. We next placed superior and inferior locking screws for additional fixation.  Next a 36 mm glenosphere was selected and impacted onto the baseplate. The center screw was  tightened.   We then repositioned the arm to give access to the humeral shaft fragment.  Drill holes were placed and fiberwire sutures in the shaft for vertical fixation of the tuberosities.  We broached with the Revive stem implants  starting with a size 9 reamer and reaming up to 13 which obtained an appropriate fit.  We did use a 20 mm spacer to accommodate for length.  The proximal body was sized separately at 13 mm and attached to trial and achieve a stable articulation.    We trialed with multiple size tray and polyethylene options and selected a 0 high which provided good stability and range of motion without excess soft tissue tension. The offset was dialed in to match the normal anatomy. The shoulder was  trialed.  There was good ROM in all planes and the shoulder was stable with no inferior translation.   We then mobilized her tuberosities again and placed the anterior deep limbs of the 5 #5 fiber wires around the stem.  1 of these was tied down fixing the greater tuberosity in place after bone graft harvest from the humeral head component was placed underneath.  A +0 high offset tray was selected and impacted onto the stem.   A 36+6 polyethylene liner was impacted onto the stem.  The joint was reduced and thoroughly irrigated with pulsatile lavage. The remaining sutures were then placed through the subscapularis and the bone tendon junction and the tuberosities were reduced after bone graft placed beneath as autograft at the subscap.  We horizontally secured the tuberosities before placing vertical fixation with the suture that was placed into the shaft.  Tuberosities moved as a unit were happy with her overall reduction.  This was checked on fluoroscopy confirming our position.  We irrigated copiously at this point.  Hemostasis was obtained. The deltopectoral interval was reapproximated with #1 Ethibond. The subcutaneous tissues were closed with 3-0 Vicryl and the skin was closed with running monocryl.     The wounds were cleaned and dried and an Aquacel dressing was placed. The drapes taken down. The arm was placed into sling with abduction pillow. Patient was awakened, extubated, and transferred to the recovery room in stable condition. There were no intraoperative complications. The sponge, needle, and attention counts were correct at the end of the case.     Noemi Chapel, PA-C, present and scrubbed throughout the case, critical for completion in a timely fashion, and for retraction, instrumentation, closure.

## 2019-10-04 NOTE — Anesthesia Preprocedure Evaluation (Addendum)
Anesthesia Evaluation  Patient identified by MRN, date of birth, ID band Patient awake    Reviewed: Allergy & Precautions, NPO status , Patient's Chart, lab work & pertinent test results  Airway Mallampati: II  TM Distance: >3 FB Neck ROM: Full    Dental no notable dental hx. (+) Teeth Intact, Dental Advisory Given   Pulmonary neg pulmonary ROS,    Pulmonary exam normal breath sounds clear to auscultation       Cardiovascular negative cardio ROS Normal cardiovascular exam Rhythm:Regular Rate:Normal     Neuro/Psych negative neurological ROS  negative psych ROS   GI/Hepatic negative GI ROS, Neg liver ROS,   Endo/Other  negative endocrine ROS  Renal/GU negative Renal ROS  negative genitourinary   Musculoskeletal  (+) Arthritis , Osteoarthritis,    Abdominal   Peds negative pediatric ROS (+)  Hematology negative hematology ROS (+)   Anesthesia Other Findings   Reproductive/Obstetrics negative OB ROS                            Anesthesia Physical Anesthesia Plan  ASA: II  Anesthesia Plan: General   Post-op Pain Management: GA combined w/ Regional for post-op pain   Induction:   PONV Risk Score and Plan:   Airway Management Planned: Oral ETT and LMA  Additional Equipment:   Intra-op Plan:   Post-operative Plan: Extubation in OR  Informed Consent: I have reviewed the patients History and Physical, chart, labs and discussed the procedure including the risks, benefits and alternatives for the proposed anesthesia with the patient or authorized representative who has indicated his/her understanding and acceptance.     Dental advisory given  Plan Discussed with: Anesthesiologist and CRNA  Anesthesia Plan Comments: (Discussed both nerve block for pain relief post-op and GA; including NV, sore throat, dental injury, and pulmonary complications)        Anesthesia Quick  Evaluation

## 2019-10-04 NOTE — Discharge Instructions (Signed)
Post Anesthesia Home Care Instructions  Activity: Get plenty of rest for the remainder of the day. A responsible individual must stay with you for 24 hours following the procedure.  For the next 24 hours, DO NOT: -Drive a car -Operate machinery -Drink alcoholic beverages -Take any medication unless instructed by your physician -Make any legal decisions or sign important papers.  Meals: Start with liquid foods such as gelatin or soup. Progress to regular foods as tolerated. Avoid greasy, spicy, heavy foods. If nausea and/or vomiting occur, drink only clear liquids until the nausea and/or vomiting subsides. Call your physician if vomiting continues.  Special Instructions/Symptoms: Your throat may feel dry or sore from the anesthesia or the breathing tube placed in your throat during surgery. If this causes discomfort, gargle with warm salt water. The discomfort should disappear within 24 hours.  If you had a scopolamine patch placed behind your ear for the management of post- operative nausea and/or vomiting:  1. The medication in the patch is effective for 72 hours, after which it should be removed.  Wrap patch in a tissue and discard in the trash. Wash hands thoroughly with soap and water. 2. You may remove the patch earlier than 72 hours if you experience unpleasant side effects which may include dry mouth, dizziness or visual disturbances. 3. Avoid touching the patch. Wash your hands with soap and water after contact with the patch.      Regional Anesthesia Blocks  1. Numbness or the inability to move the "blocked" extremity may last from 3-48 hours after placement. The length of time depends on the medication injected and your individual response to the medication. If the numbness is not going away after 48 hours, call your surgeon.  2. The extremity that is blocked will need to be protected until the numbness is gone and the  Strength has returned. Because you cannot feel it, you  will need to take extra care to avoid injury. Because it may be weak, you may have difficulty moving it or using it. You may not know what position it is in without looking at it while the block is in effect.  3. For blocks in the legs and feet, returning to weight bearing and walking needs to be done carefully. You will need to wait until the numbness is entirely gone and the strength has returned. You should be able to move your leg and foot normally before you try and bear weight or walk. You will need someone to be with you when you first try to ensure you do not fall and possibly risk injury.  4. Bruising and tenderness at the needle site are common side effects and will resolve in a few days.  5. Persistent numbness or new problems with movement should be communicated to the surgeon or the [facility] ([phone])/ [facility] ([phone]).  Information for Discharge Teaching: EXPAREL (bupivacaine liposome injectable suspension)   Your surgeon or anesthesiologist gave you EXPAREL(bupivacaine) to help control your pain after surgery.   EXPAREL is a local anesthetic that provides pain relief by numbing the tissue around the surgical site.  EXPAREL is designed to release pain medication over time and can control pain for up to 72 hours.  Depending on how you respond to EXPAREL, you may require less pain medication during your recovery.  Possible side effects:  Temporary loss of sensation or ability to move in the area where bupivacaine was injected.  Nausea, vomiting, constipation  Rarely,   numbness and tingling in your mouth or lips, lightheadedness, or anxiety may occur.  Call your doctor right away if you think you may be experiencing any of these sensations, or if you have other questions regarding possible side effects.  Follow all other discharge instructions given to you by your surgeon or nurse. Eat a healthy diet and drink plenty of water or other  fluids.  If you return to the hospital for any reason within 96 hours following the administration of EXPAREL, it is important for health care providers to know that you have received this anesthetic. A teal colored band has been placed on your arm with the date, time and amount of EXPAREL you have received in order to alert and inform your health care providers. Please leave this armband in place for the full 96 hours following administration, and then you may remove the band. 

## 2019-10-04 NOTE — Interval H&P Note (Signed)
All questions answered

## 2019-10-04 NOTE — Anesthesia Postprocedure Evaluation (Signed)
Anesthesia Post Note  Patient: Alicia Reese  Procedure(s) Performed: RIGHT REVERSE TOTAL  SHOULDER ARTHROPLASTY (Right Shoulder)     Patient location during evaluation: PACU Anesthesia Type: General Level of consciousness: awake and alert Pain management: pain level controlled Vital Signs Assessment: post-procedure vital signs reviewed and stable Respiratory status: spontaneous breathing, nonlabored ventilation, respiratory function stable and patient connected to nasal cannula oxygen Cardiovascular status: blood pressure returned to baseline and stable Postop Assessment: no apparent nausea or vomiting Anesthetic complications: no   No complications documented.  Last Vitals:  Vitals:   10/04/19 1145 10/04/19 1202  BP: 118/73 109/73  Pulse: 92 (!) 103  Resp: 19 16  Temp:  37.8 C  SpO2: 99% 95%    Last Pain:  Vitals:   10/04/19 1202  TempSrc:   PainSc: 0-No pain                 Satonya Lux

## 2019-10-04 NOTE — Anesthesia Procedure Notes (Signed)
Anesthesia Regional Block: Interscalene brachial plexus block   Pre-Anesthetic Checklist: ,, timeout performed, Correct Patient, Correct Site, Correct Laterality, Correct Procedure, Correct Position, site marked, Risks and benefits discussed,  Surgical consent,  Pre-op evaluation,  At surgeon's request and post-op pain management  Laterality: Right  Prep: chloraprep       Needles:  Injection technique: Single-shot  Needle Type: Echogenic Stimulator Needle     Needle Length: 5cm  Needle Gauge: 22     Additional Needles:   Procedures:, nerve stimulator,,, ultrasound used (permanent image in chart),,,,   Nerve Stimulator or Paresthesia:  Response: hand, 0.45 mA,   Additional Responses:   Narrative:  Start time: 10/04/2019 8:46 AM End time: 10/04/2019 8:52 AM Injection made incrementally with aspirations every 5 mL.  Performed by: Personally  Anesthesiologist: Bethena Midget, MD  Additional Notes: Functioning IV was confirmed and monitors were applied.  A 19mm 22ga Arrow echogenic stimulator needle was used. Sterile prep and drape,hand hygiene and sterile gloves were used. Ultrasound guidance: relevant anatomy identified, needle position confirmed, local anesthetic spread visualized around nerve(s)., vascular puncture avoided.  Image printed for medical record. Negative aspiration and negative test dose prior to incremental administration of local anesthetic. The patient tolerated the procedure well.

## 2019-10-04 NOTE — Anesthesia Procedure Notes (Signed)
Procedure Name: Intubation Date/Time: 10/04/2019 9:18 AM Performed by: [place] Trampas Stettner C, CRNA Pre-anesthesia Checklist: Patient identified, Emergency Drugs available, Suction available and Patient being monitored Patient Re-evaluated:Patient Re-evaluated prior to induction Oxygen Delivery Method: Circle system utilized Preoxygenation: Pre-oxygenation with 100% oxygen Induction Type: IV induction Ventilation: Mask ventilation without difficulty Laryngoscope Size: Mac and 3 Grade View: Grade II Tube type: Oral Tube size: 7.0 mm Number of attempts: 1 Airway Equipment and Method: Stylet and Oral airway Placement Confirmation: ETT inserted through vocal cords under direct vision,  positive ETCO2 and breath sounds checked- equal and bilateral Tube secured with: Tape Dental Injury: Teeth and Oropharynx as per pre-operative assessment

## 2019-10-04 NOTE — Transfer of Care (Signed)
Immediate Anesthesia Transfer of Care Note  Patient: Alicia Reese  Procedure(s) Performed: RIGHT REVERSE TOTAL  SHOULDER ARTHROPLASTY (Right Shoulder)  Patient Location: PACU  Anesthesia Type:GA combined with regional for post-op pain  Level of Consciousness: awake, alert  and oriented  Airway & Oxygen Therapy: Patient Spontanous Breathing and Patient connected to nasal cannula oxygen  Post-op Assessment: Report given to RN and Post -op Vital signs reviewed and stable  Post vital signs: stable  Last Vitals:  Vitals Value Taken Time  BP 132/62 10/04/19 1112  Temp    Pulse 96 10/04/19 1113  Resp 16 10/04/19 1113  SpO2 100 % 10/04/19 1113  Vitals shown include unvalidated device data.  Last Pain:  Vitals:   10/04/19 0825  TempSrc: Oral  PainSc: 8          Complications: No complications documented.

## 2019-10-05 ENCOUNTER — Encounter (HOSPITAL_BASED_OUTPATIENT_CLINIC_OR_DEPARTMENT_OTHER): Payer: Self-pay | Admitting: Orthopaedic Surgery

## 2020-12-29 IMAGING — CT CT HUMERUS*R* W/O CM
3 series · 10 of 34 positions shown, 12 images · non-contrast
Comparison: None.

CLINICAL DATA: Proximal humeral fracture status post fall
09/25/2019.

EXAM:
CT OF THE RIGHT HUMERUS WITHOUT CONTRAST
CT - 3-DIMENSIONAL CT IMAGE RENDERING ON INDEPENDENT WORKSTATION
TECHNIQUE: Multidetector CT imaging was performed according to the standard
protocol. Multiplanar CT image reconstructions were also generated.

[Series 4: shoulder 2.00 br40 s3 axial soft · axial · 0.39mm/px · z∈[-961,-799]mm · 2 of 189 slices shown, 3 images]
[im 58/189  soft-tissue]
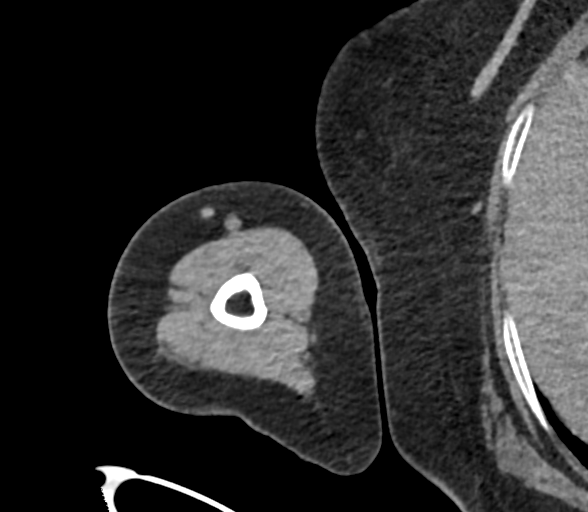
[im 58/189  bone]
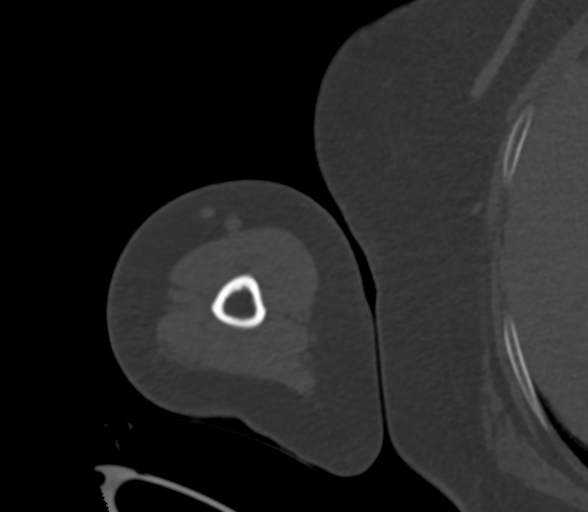
[im 145/189  bone]
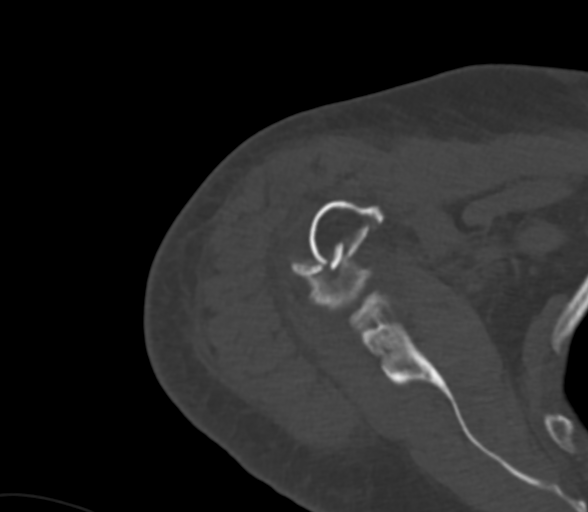

[Series 8: shoulder 2.00 br40 s3 cor soft · coronal · 0.45mm/px · 3 of 99 slices shown]
[im 20/99  bone]
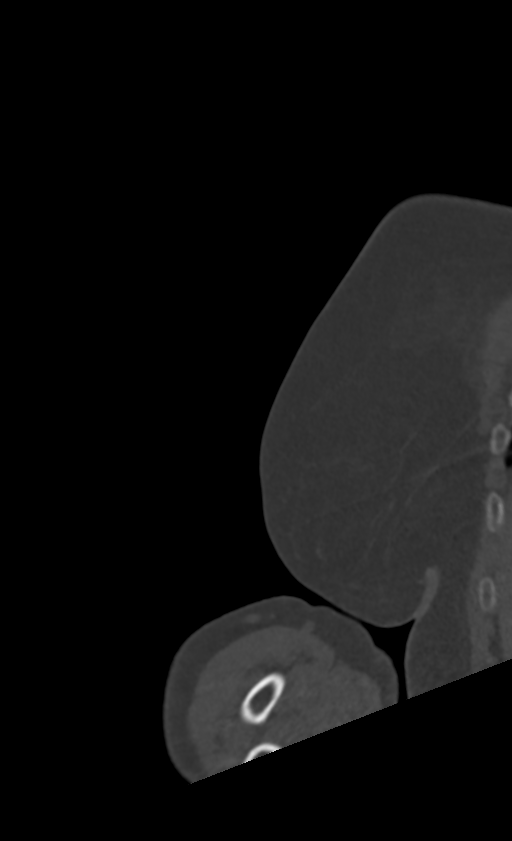
[im 40/99  bone]
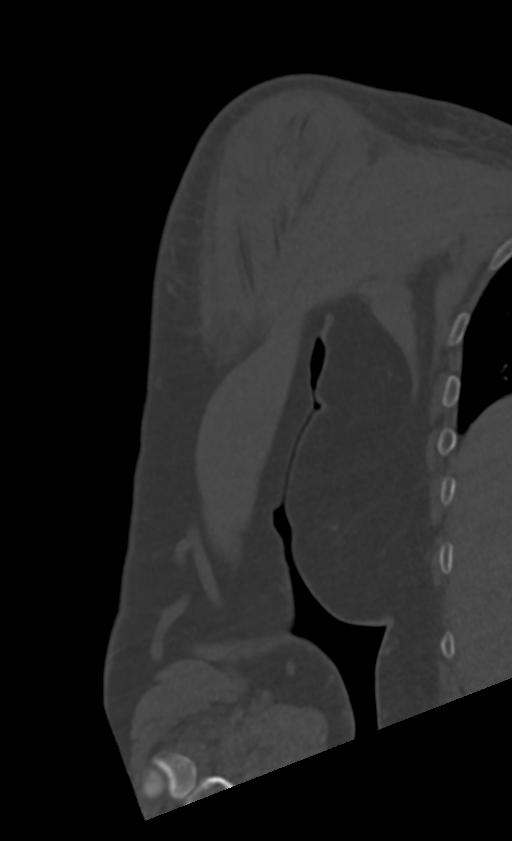
[im 59/99  bone]
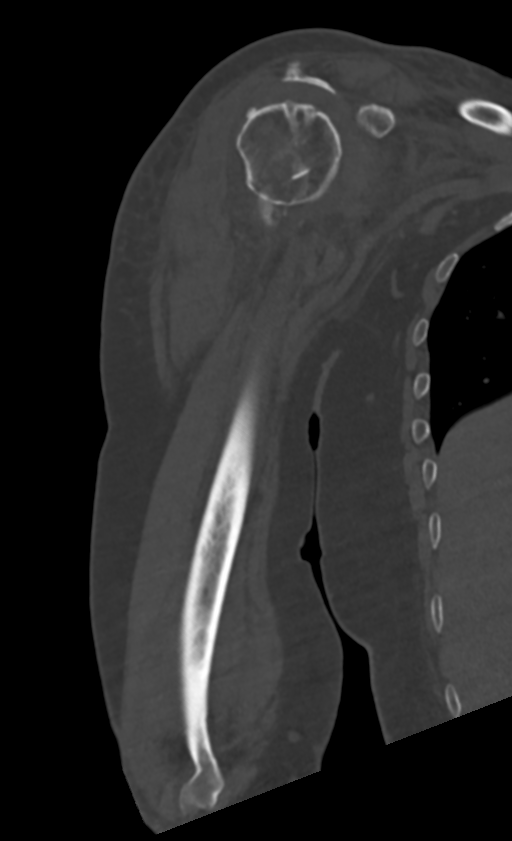

[Series 12: shoulder 2.00 br40 s3 sag soft · sagittal · 0.39mm/px · 5 of 115 slices shown, 6 images]
[im 48/115  bone]
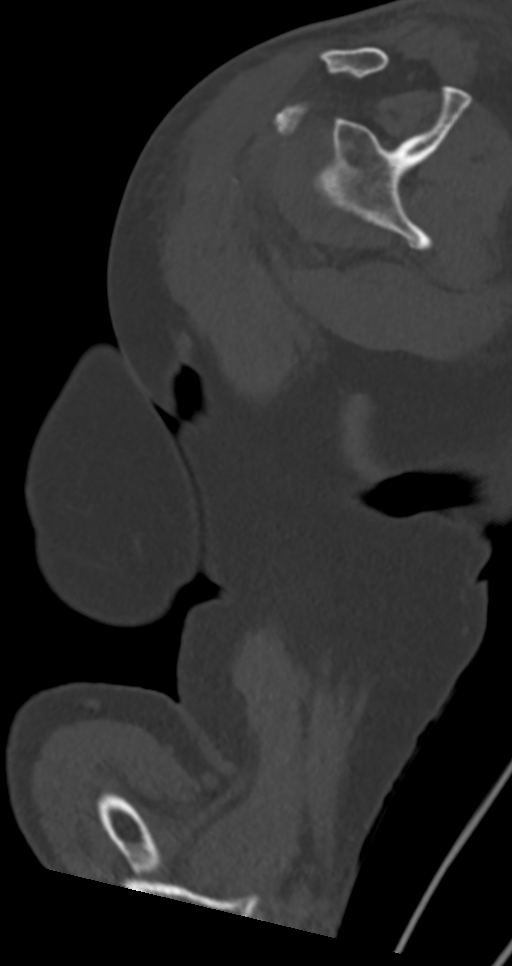
[im 53/115  bone]
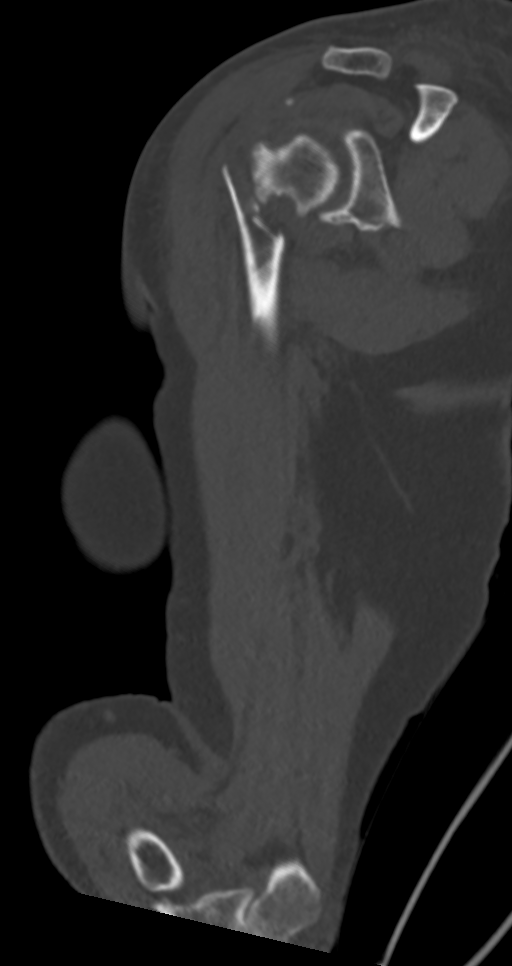
[im 58/115  soft-tissue]
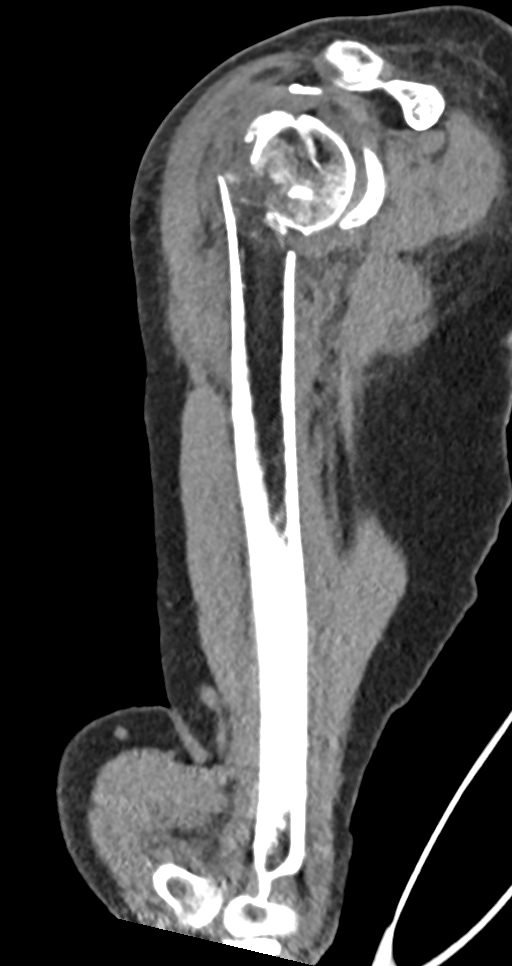
[im 58/115  bone]
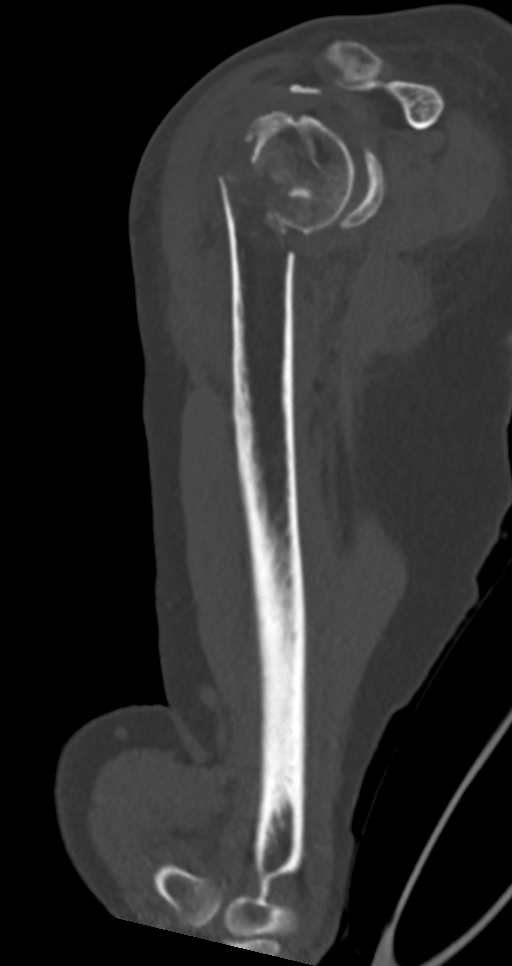
[im 62/115  bone]
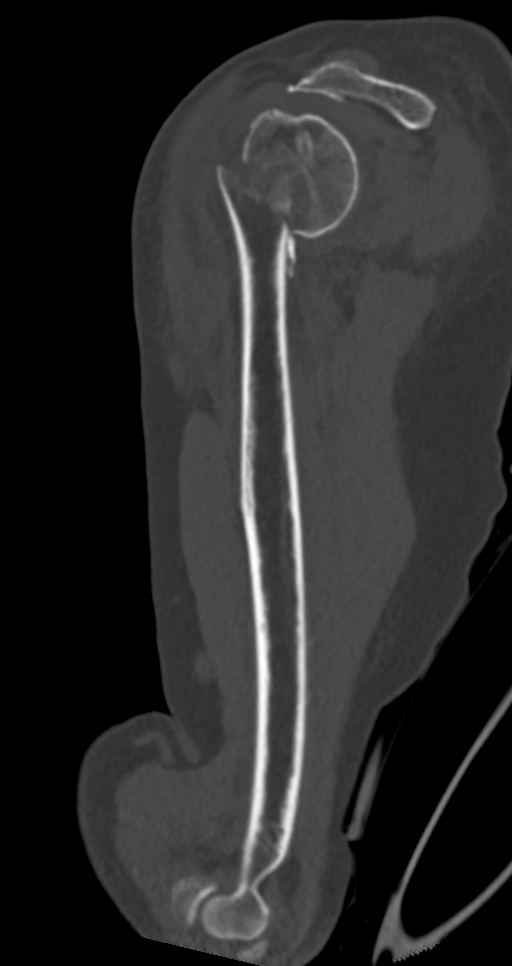
[im 67/115  bone]
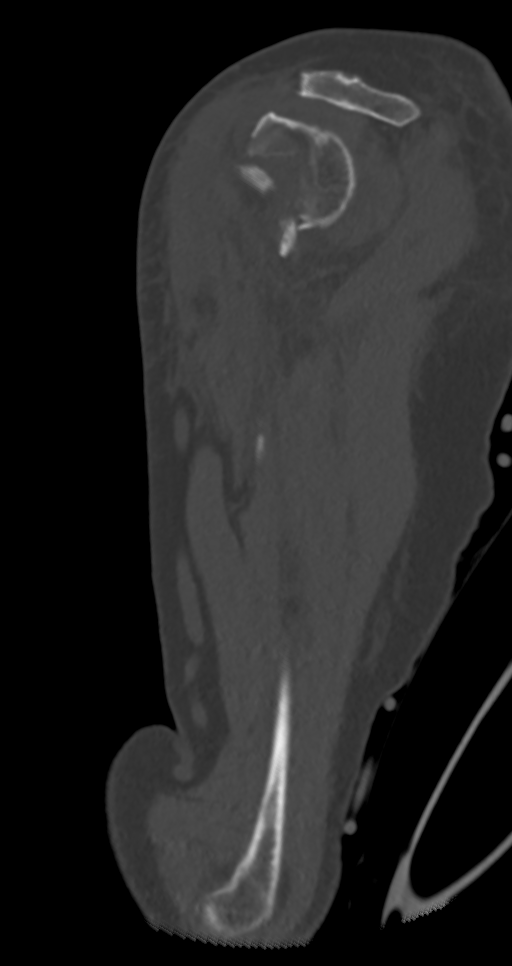

[10 of 34 positions shown; findings below may reference images not displayed]

FINDINGS: Bones/Joint/Cartilage

Comminuted fracture of the surgical neck of the right proximal
humerus with 11 mm of lateral displacement and 17 mm of anterior
displacement. No significant angulation.

No other acute fracture or dislocation. Normal alignment. No joint
effusion. Prior rotator cuff repair. Glenohumeral joint space is
relatively well maintained. Mild arthropathy of the
acromioclavicular joint.

Ligaments

Ligaments are suboptimally evaluated by CT.

Muscles and Tendons
Muscles are normal.  No muscle atrophy.

Soft tissue
No fluid collection or hematoma.  No soft tissue mass.
IMPRESSION: Comminuted fracture of the surgical neck of the right proximal
humerus with 11 mm of lateral displacement and 17 mm of anterior
displacement. No significant angulation.

## 2020-12-29 IMAGING — CT CT 3D INDEPENDENT WKST
3 series · 10 of 34 positions shown, 12 images · non-contrast
Comparison: None.

CLINICAL DATA: Proximal humeral fracture status post fall
09/25/2019.

EXAM:
CT OF THE RIGHT HUMERUS WITHOUT CONTRAST
CT - 3-DIMENSIONAL CT IMAGE RENDERING ON INDEPENDENT WORKSTATION
TECHNIQUE: Multidetector CT imaging was performed according to the standard
protocol. Multiplanar CT image reconstructions were also generated.

[Series 4: shoulder 2.00 br40 s3 axial soft · axial · 0.39mm/px · z∈[-961,-799]mm · 2 of 189 slices shown, 3 images]
[im 58/189  soft-tissue]
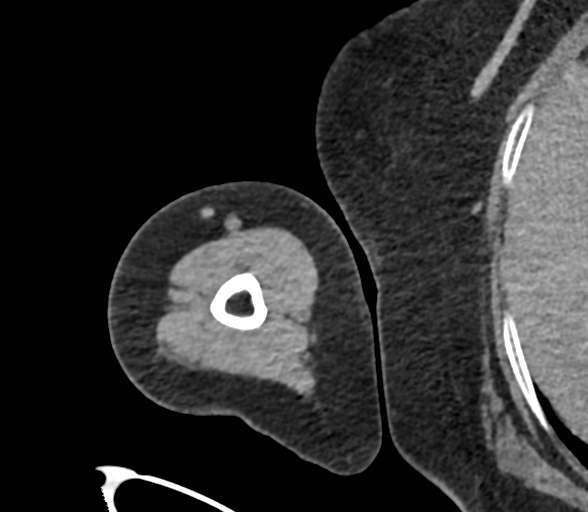
[im 58/189  bone]
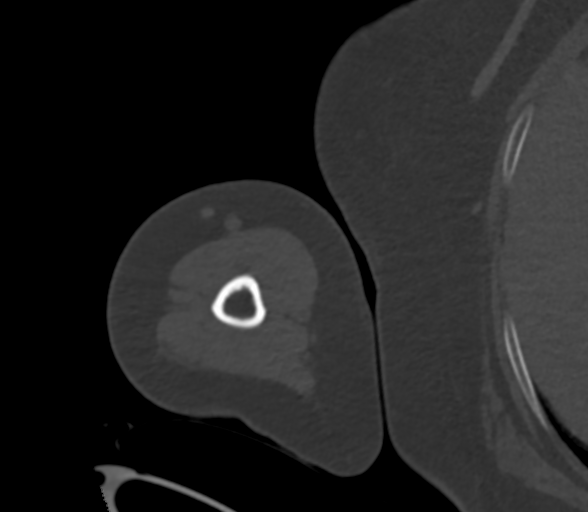
[im 145/189  bone]
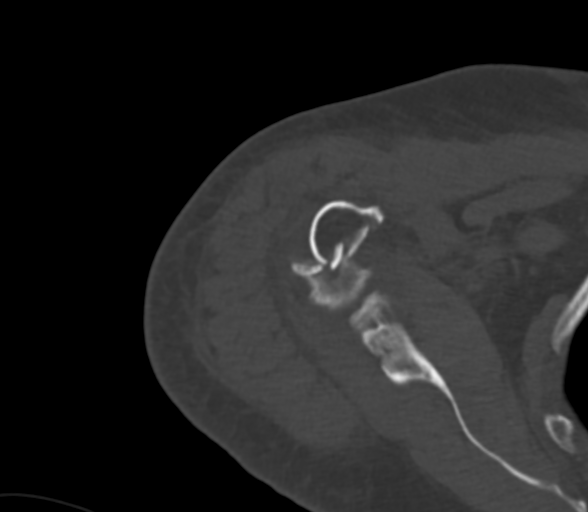

[Series 8: shoulder 2.00 br40 s3 cor soft · coronal · 0.45mm/px · 3 of 99 slices shown]
[im 20/99  bone]
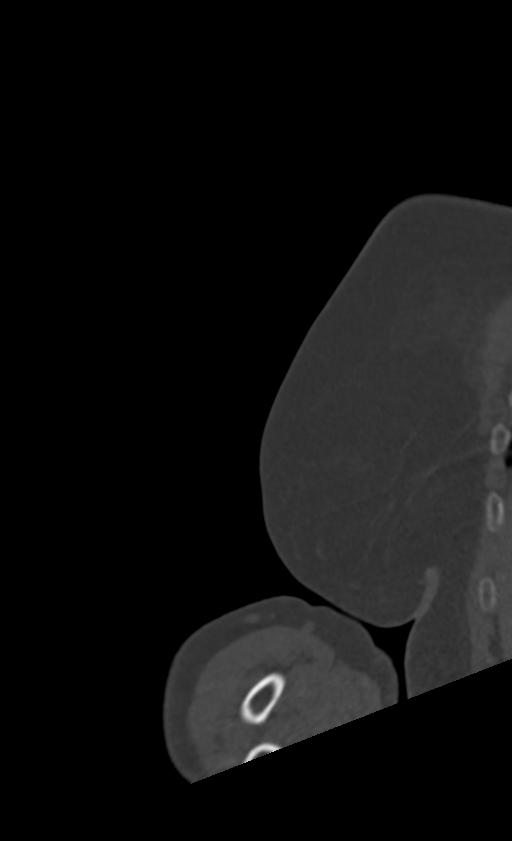
[im 40/99  bone]
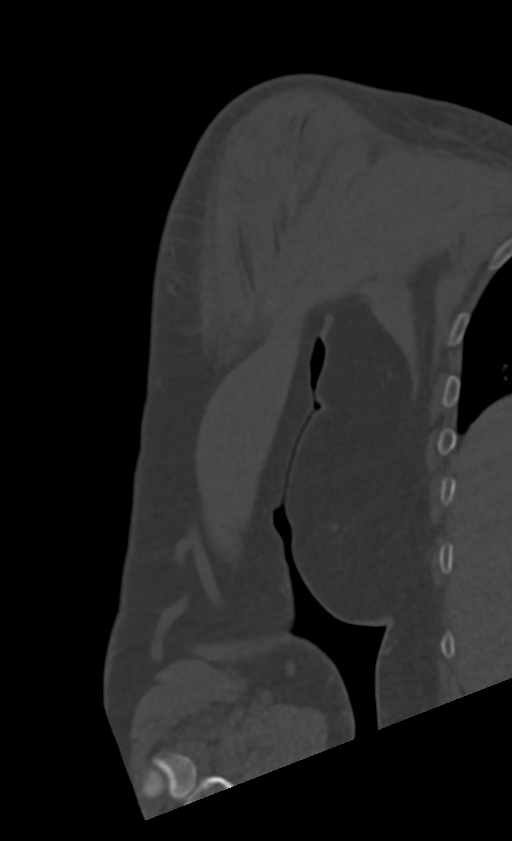
[im 59/99  bone]
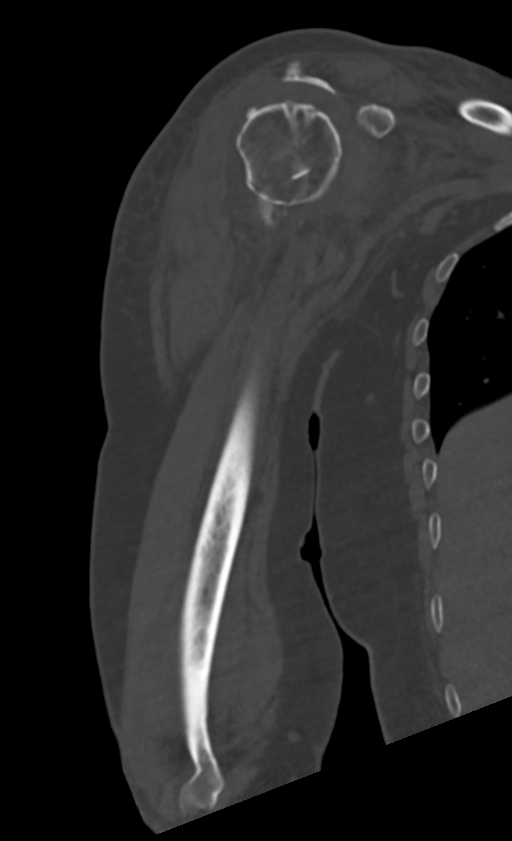

[Series 12: shoulder 2.00 br40 s3 sag soft · sagittal · 0.39mm/px · 5 of 115 slices shown, 6 images]
[im 48/115  bone]
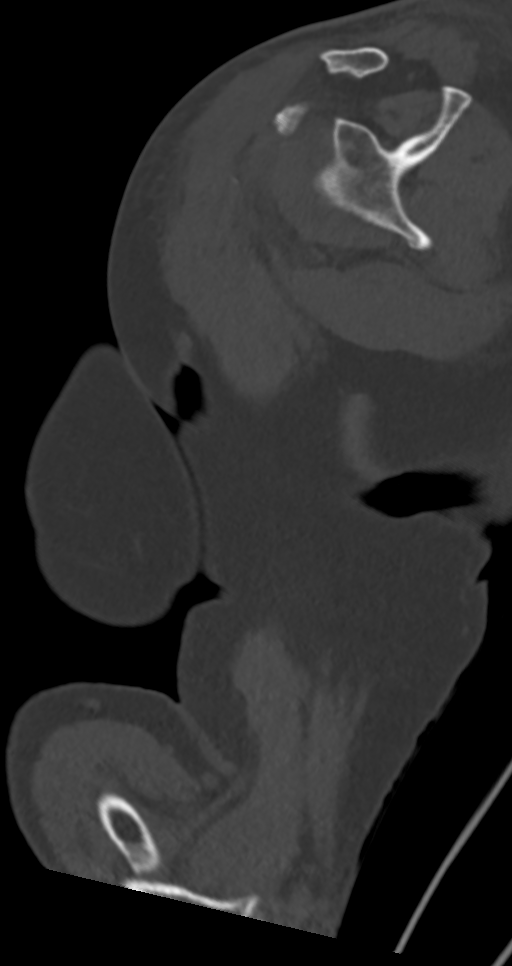
[im 53/115  bone]
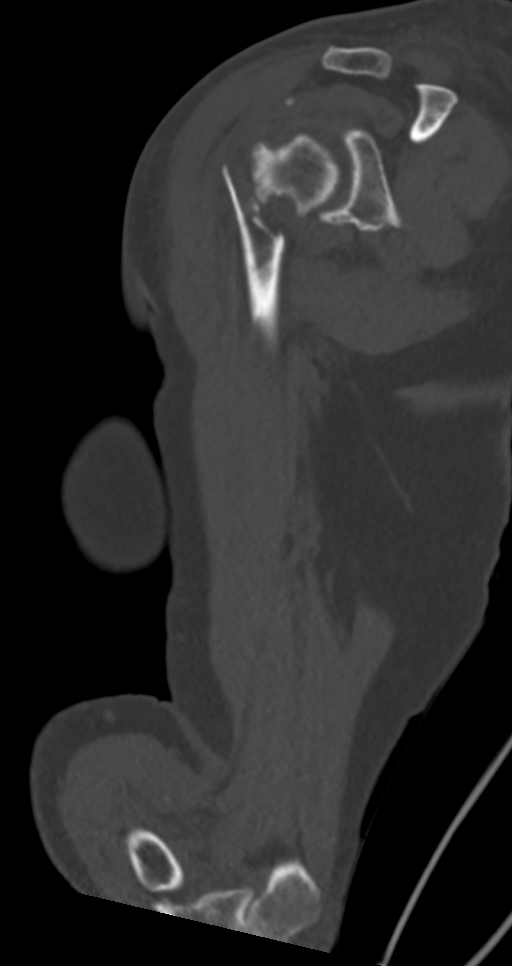
[im 58/115  soft-tissue]
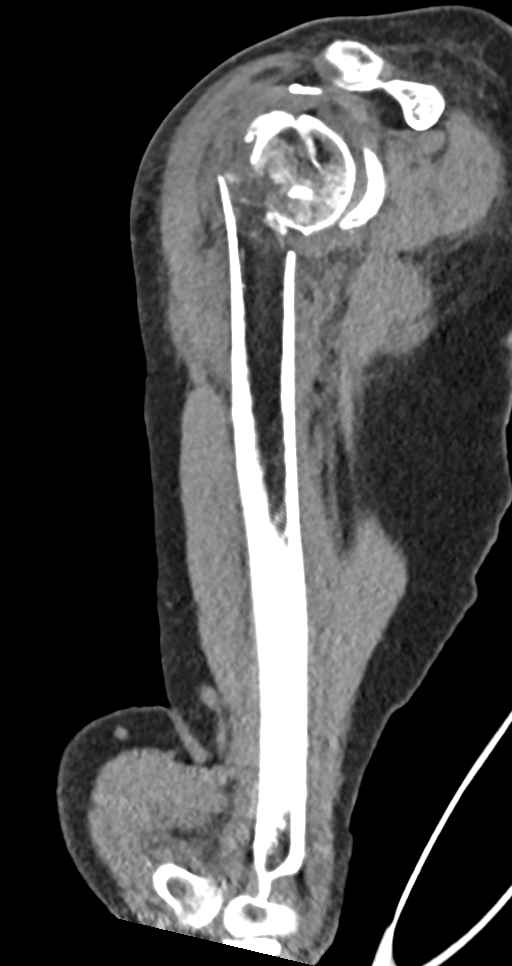
[im 58/115  bone]
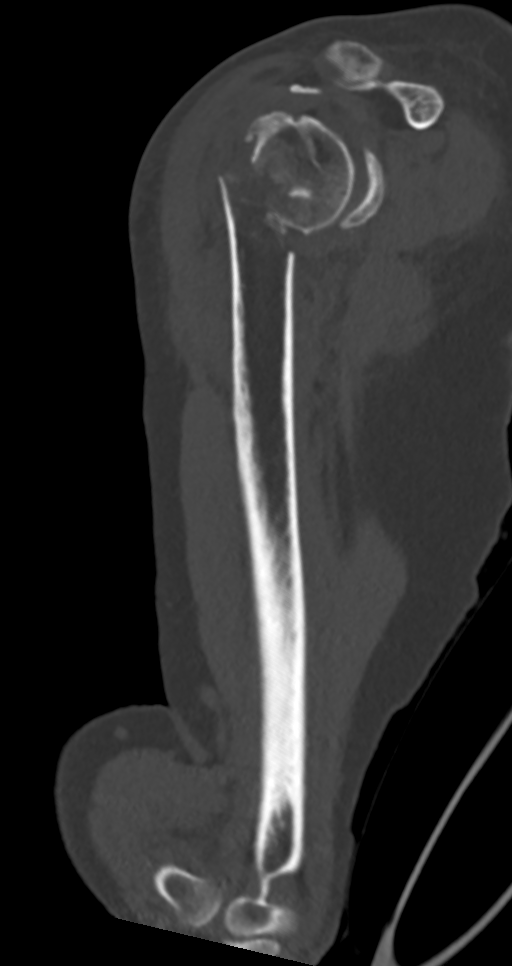
[im 62/115  bone]
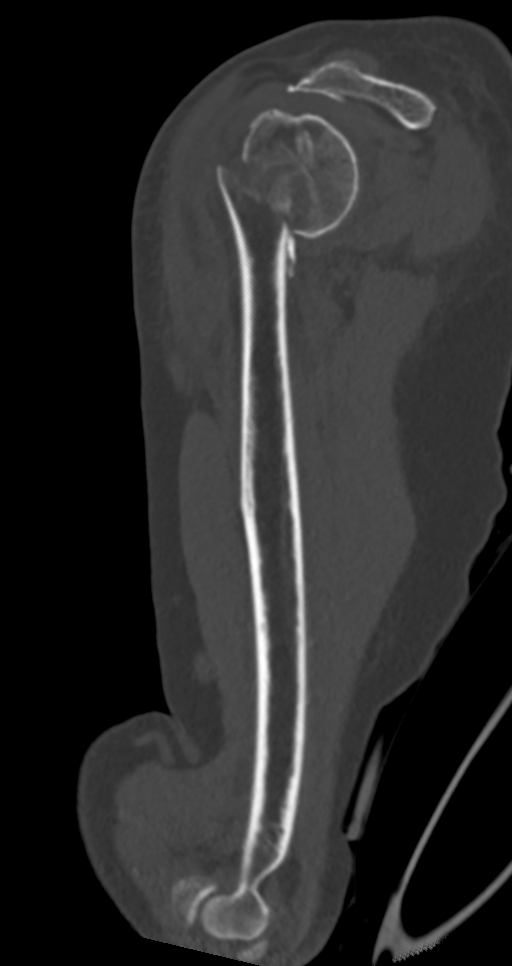
[im 67/115  bone]
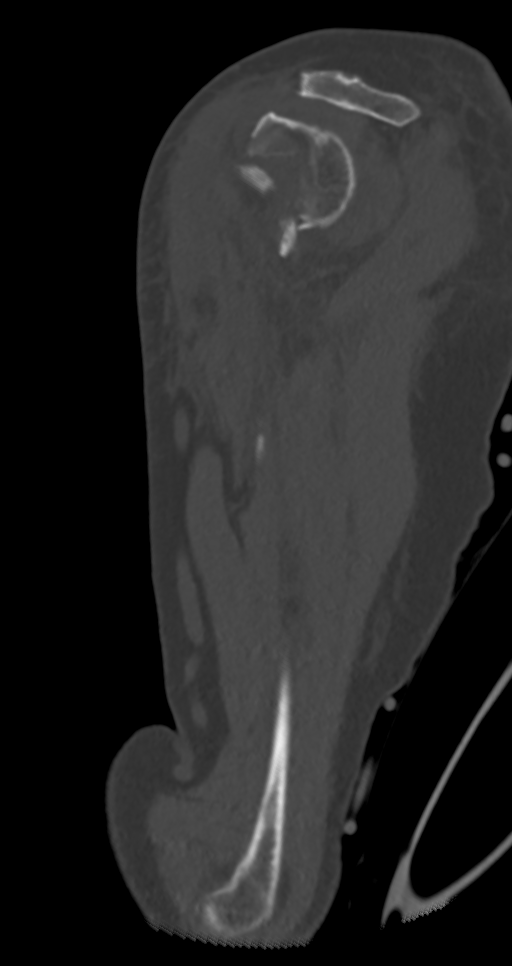

[10 of 34 positions shown; findings below may reference images not displayed]

FINDINGS: Bones/Joint/Cartilage

Comminuted fracture of the surgical neck of the right proximal
humerus with 11 mm of lateral displacement and 17 mm of anterior
displacement. No significant angulation.

No other acute fracture or dislocation. Normal alignment. No joint
effusion. Prior rotator cuff repair. Glenohumeral joint space is
relatively well maintained. Mild arthropathy of the
acromioclavicular joint.

Ligaments

Ligaments are suboptimally evaluated by CT.

Muscles and Tendons
Muscles are normal.  No muscle atrophy.

Soft tissue
No fluid collection or hematoma.  No soft tissue mass.
IMPRESSION: Comminuted fracture of the surgical neck of the right proximal
humerus with 11 mm of lateral displacement and 17 mm of anterior
displacement. No significant angulation.

## 2021-01-04 IMAGING — CR DG SHOULDER 2+V PORT*R*
1 series · 1 of 1 positions shown · non-contrast
Comparison: None.

CLINICAL DATA: Postop right shoulder replacement.

EXAM:
PORTABLE RIGHT SHOULDER

[shoulder ap]
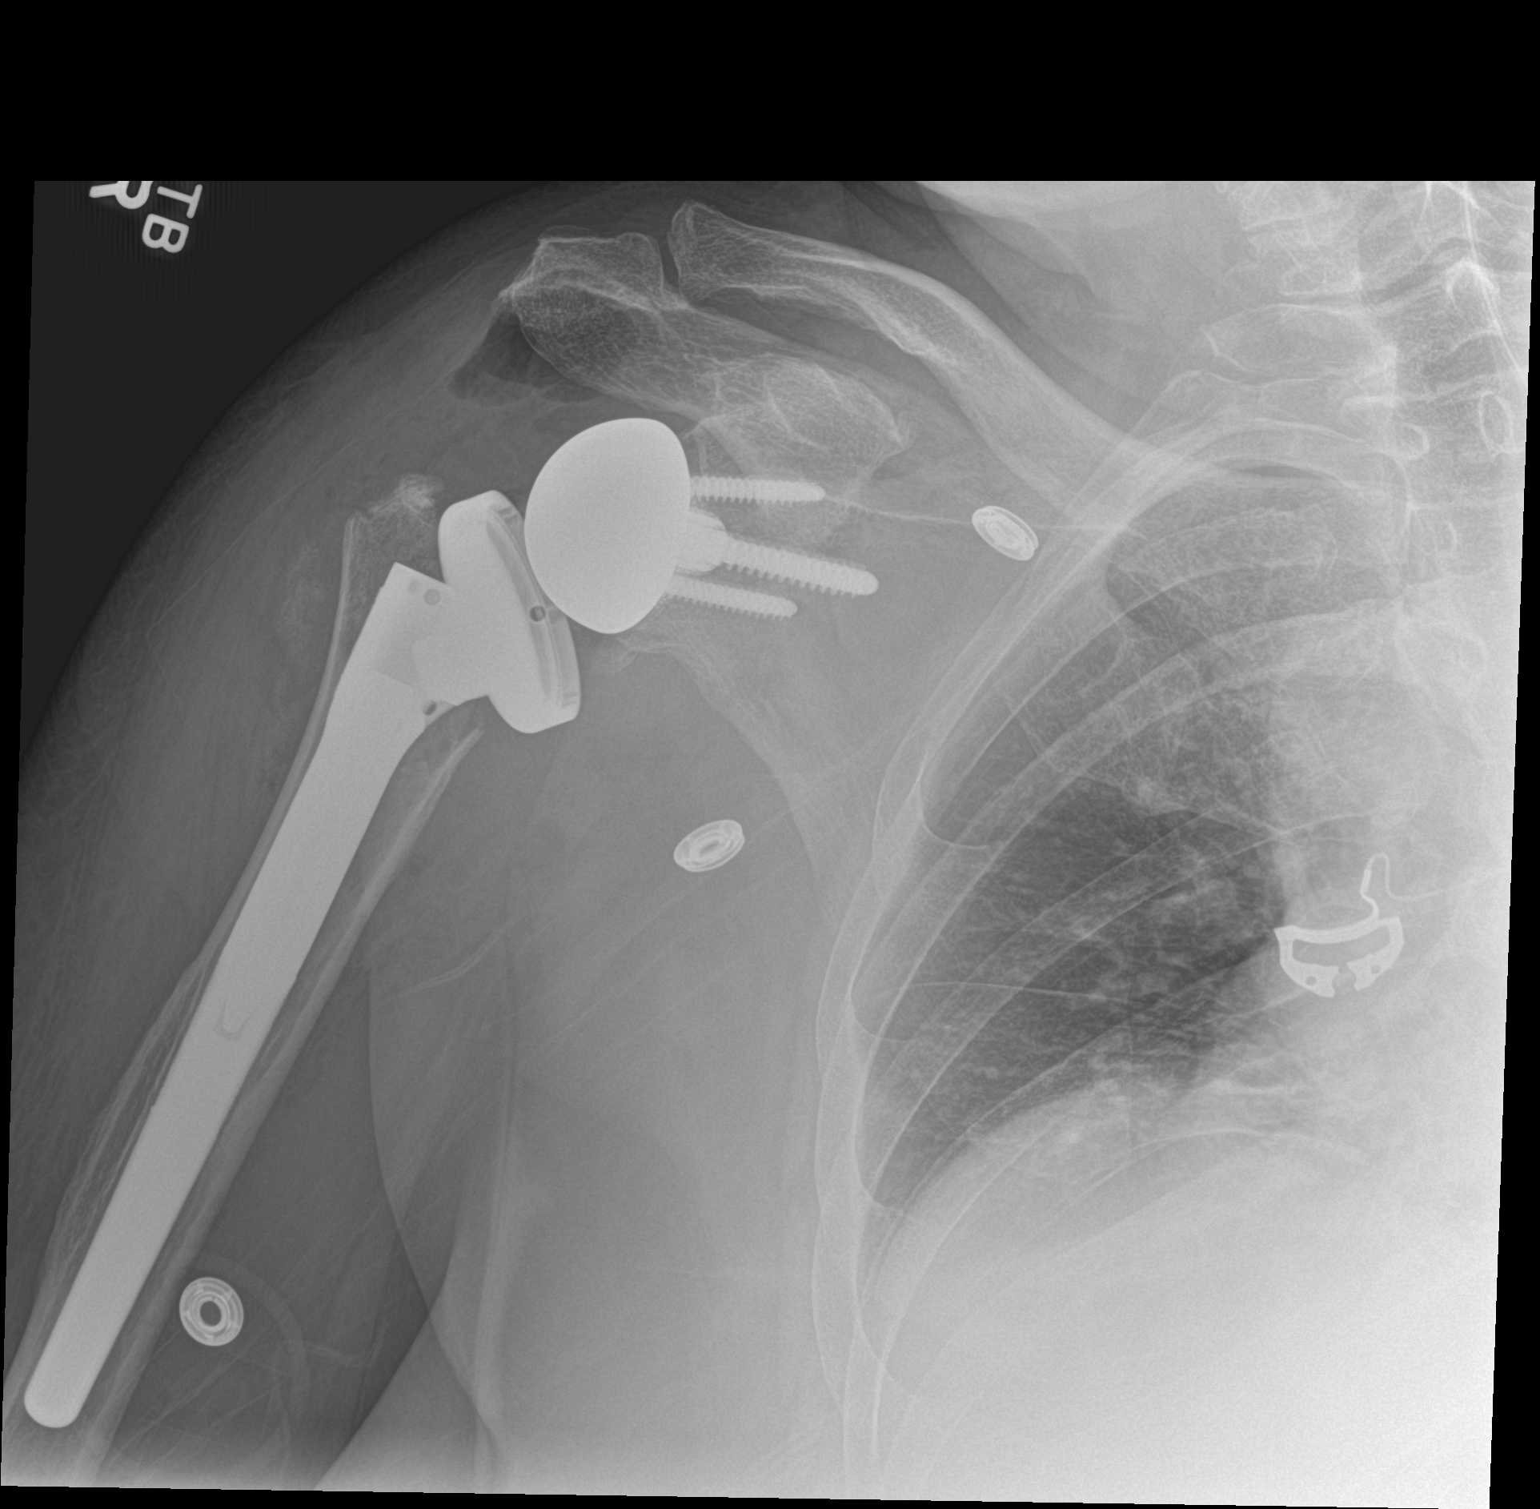

[1 of 1 positions shown; findings below may reference images not displayed]

FINDINGS: Reverse right shoulder arthroplasty in expected alignment. No
periprosthetic lucency or fracture. Recent postsurgical change
includes air and edema in the joint space and soft tissues.
IMPRESSION: Reverse right shoulder arthroplasty without immediate postoperative
complication.

## 2021-01-20 ENCOUNTER — Emergency Department: Payer: 59

## 2021-01-20 ENCOUNTER — Other Ambulatory Visit: Payer: Self-pay

## 2021-01-20 ENCOUNTER — Emergency Department
Admission: EM | Admit: 2021-01-20 | Discharge: 2021-01-20 | Disposition: A | Discharge status: Home / Self Care | Payer: 59 | Attending: Emergency Medicine | Admitting: Emergency Medicine

## 2021-01-20 DIAGNOSIS — R112 Nausea with vomiting, unspecified: Secondary | ICD-10-CM | POA: Insufficient documentation

## 2021-01-20 DIAGNOSIS — R079 Chest pain, unspecified: Secondary | ICD-10-CM | POA: Insufficient documentation

## 2021-01-20 DIAGNOSIS — R1011 Right upper quadrant pain: Secondary | ICD-10-CM | POA: Diagnosis present

## 2021-01-20 DIAGNOSIS — R000 Tachycardia, unspecified: Secondary | ICD-10-CM | POA: Diagnosis not present

## 2021-01-20 DIAGNOSIS — R319 Hematuria, unspecified: Secondary | ICD-10-CM | POA: Diagnosis not present

## 2021-01-20 DIAGNOSIS — N390 Urinary tract infection, site not specified: Secondary | ICD-10-CM | POA: Insufficient documentation

## 2021-01-20 LAB — CBC
HCT: 43.2 % (ref 36.0–46.0)
Hemoglobin: 14.5 g/dL (ref 12.0–15.0)
MCH: 32 pg (ref 26.0–34.0)
MCHC: 33.6 g/dL (ref 30.0–36.0)
MCV: 95.4 fL (ref 80.0–100.0)
Platelets: 267 10*3/uL (ref 150–400)
RBC: 4.53 MIL/uL (ref 3.87–5.11)
RDW: 12.8 % (ref 11.5–15.5)
WBC: 7.4 10*3/uL (ref 4.0–10.5)
nRBC: 0 % (ref 0.0–0.2)

## 2021-01-20 LAB — COMPREHENSIVE METABOLIC PANEL
ALT: 21 U/L (ref 0–44)
AST: 26 U/L (ref 15–41)
Albumin: 3.8 g/dL (ref 3.5–5.0)
Alkaline Phosphatase: 56 U/L (ref 38–126)
Anion gap: 13 (ref 5–15)
BUN: 15 mg/dL (ref 8–23)
CO2: 24 mmol/L (ref 22–32)
Calcium: 9.4 mg/dL (ref 8.9–10.3)
Chloride: 99 mmol/L (ref 98–111)
Creatinine, Ser: 1.18 mg/dL — ABNORMAL HIGH (ref 0.44–1.00)
GFR, Estimated: 52 mL/min — ABNORMAL LOW (ref >60)
Glucose, Bld: 108 mg/dL — ABNORMAL HIGH (ref 70–99)
Potassium: 4 mmol/L (ref 3.5–5.1)
Sodium: 136 mmol/L (ref 135–145)
Total Bilirubin: 1.5 mg/dL — ABNORMAL HIGH (ref 0.3–1.2)
Total Protein: 8.1 g/dL (ref 6.5–8.1)

## 2021-01-20 LAB — LIPASE, BLOOD: Lipase: 29 U/L (ref 11–51)

## 2021-01-20 LAB — URINALYSIS, ROUTINE W REFLEX MICROSCOPIC
Glucose, UA: NEGATIVE mg/dL
Ketones, ur: 40 mg/dL — AB
Nitrite: POSITIVE — AB
Protein, ur: 100 mg/dL — AB
Specific Gravity, Urine: 1.025 (ref 1.005–1.030)
pH: 5.5 (ref 5.0–8.0)

## 2021-01-20 LAB — URINALYSIS, MICROSCOPIC (REFLEX)
Bacteria, UA: NONE SEEN
WBC, UA: >50 WBC/hpf (ref 0–5)

## 2021-01-20 MED ORDER — CEFDINIR 300 MG PO CAPS
300.0000 mg | ORAL_CAPSULE | Freq: Two times a day (BID) | ORAL | 0 refills | Status: AC
Start: 2021-01-20 — End: 2021-01-25

## 2021-01-20 MED ORDER — ONDANSETRON 8 MG PO TBDP: 8.0000 mg | ORAL_TABLET | Freq: Three times a day (TID) | ORAL | 0 refills | Status: DC | PRN

## 2021-01-20 NOTE — ED Provider Notes (Signed)
[facility] Provider Note    Event Date/Time   First MD Initiated Contact with Patient 01/20/21 1245     (approximate)  History   Abdominal Pain  HPI Alicia Reese is a 66 y.o. female who [facility] complaints of right upper quadrant abdominal pain that radiates into her back and has been going on for the last 3 days.  Patient endorses associated nausea/vomiting    Physical Exam   Triage Vital Signs: ED Triage Vitals [01/20/21 1035]  Enc Vitals Group     BP 120/83     Pulse Rate (!) 128     Resp 19     Temp 97.8 F (36.6 C)     Temp Source Oral     SpO2 98 %     Weight 204 lb (92.5 kg)     Height 5' 7.5" (1.715 m)     Head Circumference      Peak Flow      Pain Score      Pain Loc      Pain Edu?      Excl. in [place]?     Most recent vital signs: Vitals:   01/20/21 1035  BP: 120/83  Pulse: (!) 128  Resp: 19  Temp: 97.8 F (36.6 C)  SpO2: 98%    General: Awake, no distress.  CV:  Good peripheral perfusion.  Resp:  Normal effort.  Abd:  No distention.  Other:  Positive right CVA tenderness   ED Results / Procedures / Treatments   Labs (all labs ordered are listed, but only abnormal results are displayed) Labs Reviewed  COMPREHENSIVE METABOLIC PANEL - Abnormal; Notable for the following components:      Result Value   Glucose, Bld 108 (*)    Creatinine, Ser 1.18 (*)    Total Bilirubin 1.5 (*)    GFR, Estimated 52 (*)    All other components within normal limits  URINALYSIS, ROUTINE W REFLEX MICROSCOPIC - Abnormal; Notable for the following components:   Hgb urine dipstick TRACE (*)    Bilirubin Urine LARGE (*)    Ketones, ur 40 (*)    Protein, ur 100 (*)    Nitrite POSITIVE (*)    Leukocytes,Ua LARGE (*)    All other components within normal limits  URINALYSIS, MICROSCOPIC (REFLEX) - Abnormal; Notable for the following components:   Non Squamous Epithelial PRESENT (*)    All other components within normal  limits  LIPASE, BLOOD  CBC     EKG ED ECG REPORT I, Naaman Plummer, the attending physician, personally viewed and interpreted this ECG.  Date: 01/20/2021 EKG Time: 1039 Rate: 112 Rhythm: normal sinus rhythm QRS Axis: normal Intervals: normal ST/T Wave abnormalities: normal Narrative Interpretation: no evidence of acute ischemia   RADIOLOGY ED MD interpretation: Right upper quadrant ultrasound shows no evidence of acute process or explanation for right upper quadrant abdominal pain.  Agree with radiologist evaluation  Official radiology report(s): US Abdomen Limited RUQ (LIVER/GB)  Result Date: 01/20/2021 CLINICAL DATA:  Right upper quadrant pain for 4 days EXAM: ULTRASOUND ABDOMEN LIMITED RIGHT UPPER QUADRANT COMPARISON:  None. FINDINGS: Gallbladder: No gallstones or wall thickening visualized. No sonographic Murphy sign noted by sonographer. Common bile duct: Diameter: Normal, 3 mm. Liver: Increased hepatic echogenicity. Portal vein is patent on color Doppler imaging with normal direction of blood flow towards the liver. Other: None. IMPRESSION: No acute process or explanation for right upper quadrant pain. Increased  hepatic echogenicity, favoring steatosis. Electronically Signed   By: Abigail Miyamoto M.D.   On: 01/20/2021 14:32      PROCEDURES:  Critical Care performed: No  Procedures   MEDICATIONS ORDERED IN ED: Medications - No data to display   IMPRESSION / MDM / [place] / ED COURSE  I reviewed the triage vital signs and the nursing notes.                              Differential diagnosis includes, but is not limited to, cholecystitis, ascending cholangitis, pyelonephritis, kidney stone  The patient is on the cardiac monitor to evaluate for evidence of arrhythmia and/or significant heart rate changes.  Not Pregnant. Unlikely TOA, Ovarian Torsion, PID, gonorrhea/chlamydia. Low suspicion for Infected Urolithiasis, AAA, Cholecystitis, Pancreatitis,  SBO, Appendicitis, or other acute abdomen.  Rx: Cefdinir 300 mg BID for 5 days Disposition: Discharge home. SRP discussed. Advise follow up with primary care provider within 24-72 hours.       FINAL CLINICAL IMPRESSION(S) / ED DIAGNOSES   Final diagnoses:  RUQ pain  Urinary tract infection with hematuria, site unspecified  Right upper quadrant abdominal pain     Rx / DC Orders   ED Discharge Orders     None        Note:  This document was prepared using Dragon voice recognition software and may include unintentional dictation errors.   Naaman Plummer, MD 01/20/21 1515

## 2021-01-20 NOTE — ED Triage Notes (Signed)
Pt sent from [facility] with c/o RUQ pain that radiates into the back since Friday with N/V.Marland Kitchen

## 2021-11-19 DIAGNOSIS — K649 Unspecified hemorrhoids: Secondary | ICD-10-CM | POA: Diagnosis not present

## 2021-11-19 DIAGNOSIS — J309 Allergic rhinitis, unspecified: Secondary | ICD-10-CM | POA: Diagnosis not present

## 2021-11-19 DIAGNOSIS — Z1231 Encounter for screening mammogram for malignant neoplasm of breast: Secondary | ICD-10-CM | POA: Diagnosis not present

## 2021-11-19 DIAGNOSIS — G43909 Migraine, unspecified, not intractable, without status migrainosus: Secondary | ICD-10-CM | POA: Diagnosis not present

## 2021-11-20 ENCOUNTER — Other Ambulatory Visit: Payer: Self-pay | Admitting: Family Medicine

## 2021-11-20 DIAGNOSIS — Z1231 Encounter for screening mammogram for malignant neoplasm of breast: Secondary | ICD-10-CM

## 2021-12-07 ENCOUNTER — Observation Stay: Payer: Medicare HMO

## 2021-12-07 ENCOUNTER — Encounter: Payer: Self-pay | Admitting: Internal Medicine

## 2021-12-07 ENCOUNTER — Emergency Department: Payer: Medicare HMO

## 2021-12-07 ENCOUNTER — Other Ambulatory Visit: Payer: Self-pay

## 2021-12-07 ENCOUNTER — Observation Stay
Admission: EM | Admit: 2021-12-07 | Discharge: 2021-12-08 | Disposition: A | Discharge status: Home / Self Care | Payer: Medicare HMO | Attending: Internal Medicine | Admitting: Internal Medicine

## 2021-12-07 DIAGNOSIS — Z96611 Presence of right artificial shoulder joint: Secondary | ICD-10-CM | POA: Insufficient documentation

## 2021-12-07 DIAGNOSIS — R7989 Other specified abnormal findings of blood chemistry: Secondary | ICD-10-CM | POA: Diagnosis not present

## 2021-12-07 DIAGNOSIS — R000 Tachycardia, unspecified: Secondary | ICD-10-CM | POA: Diagnosis not present

## 2021-12-07 DIAGNOSIS — R0902 Hypoxemia: Secondary | ICD-10-CM | POA: Diagnosis not present

## 2021-12-07 DIAGNOSIS — R509 Fever, unspecified: Secondary | ICD-10-CM | POA: Diagnosis not present

## 2021-12-07 DIAGNOSIS — K449 Diaphragmatic hernia without obstruction or gangrene: Secondary | ICD-10-CM | POA: Diagnosis not present

## 2021-12-07 DIAGNOSIS — J069 Acute upper respiratory infection, unspecified: Secondary | ICD-10-CM

## 2021-12-07 DIAGNOSIS — Z79899 Other long term (current) drug therapy: Secondary | ICD-10-CM | POA: Diagnosis not present

## 2021-12-07 DIAGNOSIS — A419 Sepsis, unspecified organism: Secondary | ICD-10-CM | POA: Diagnosis not present

## 2021-12-07 DIAGNOSIS — U071 COVID-19: Secondary | ICD-10-CM | POA: Diagnosis not present

## 2021-12-07 DIAGNOSIS — R059 Cough, unspecified: Secondary | ICD-10-CM | POA: Diagnosis not present

## 2021-12-07 LAB — COMPREHENSIVE METABOLIC PANEL
ALT: 24 U/L (ref 0–44)
AST: 30 U/L (ref 15–41)
Albumin: 3.3 g/dL — ABNORMAL LOW (ref 3.5–5.0)
Alkaline Phosphatase: 55 U/L (ref 38–126)
Anion gap: 7 (ref 5–15)
BUN: 14 mg/dL (ref 8–23)
CO2: 23 mmol/L (ref 22–32)
Calcium: 8.5 mg/dL — ABNORMAL LOW (ref 8.9–10.3)
Chloride: 109 mmol/L (ref 98–111)
Creatinine, Ser: 1.04 mg/dL — ABNORMAL HIGH (ref 0.44–1.00)
GFR, Estimated: 60 mL/min — ABNORMAL LOW (ref >60)
Glucose, Bld: 114 mg/dL — ABNORMAL HIGH (ref 70–99)
Potassium: 4.1 mmol/L (ref 3.5–5.1)
Sodium: 139 mmol/L (ref 135–145)
Total Bilirubin: 0.7 mg/dL (ref 0.3–1.2)
Total Protein: 7 g/dL (ref 6.5–8.1)

## 2021-12-07 LAB — CBC WITH DIFFERENTIAL/PLATELET
Abs Immature Granulocytes: 0.03 10*3/uL (ref 0.00–0.07)
Basophils Absolute: 0.1 10*3/uL (ref 0.0–0.1)
Basophils Relative: 1 %
Eosinophils Absolute: 0 10*3/uL (ref 0.0–0.5)
Eosinophils Relative: 0 %
HCT: 34.6 % — ABNORMAL LOW (ref 36.0–46.0)
Hemoglobin: 12.1 g/dL (ref 12.0–15.0)
Immature Granulocytes: 1 %
Lymphocytes Relative: 6 %
Lymphs Abs: 0.4 10*3/uL — ABNORMAL LOW (ref 0.7–4.0)
MCH: 35.7 pg — ABNORMAL HIGH (ref 26.0–34.0)
MCHC: 35 g/dL (ref 30.0–36.0)
MCV: 102.1 fL — ABNORMAL HIGH (ref 80.0–100.0)
Monocytes Absolute: 0.5 10*3/uL (ref 0.1–1.0)
Monocytes Relative: 8 %
Neutro Abs: 5.5 10*3/uL (ref 1.7–7.7)
Neutrophils Relative %: 84 %
Platelets: 207 10*3/uL (ref 150–400)
RBC: 3.39 MIL/uL — ABNORMAL LOW (ref 3.87–5.11)
RDW: 13.8 % (ref 11.5–15.5)
WBC: 6.5 10*3/uL (ref 4.0–10.5)
nRBC: 0 % (ref 0.0–0.2)

## 2021-12-07 LAB — RESP PANEL BY RT-PCR (FLU A&B, COVID) ARPGX2
Influenza A by PCR: NEGATIVE
Influenza B by PCR: NEGATIVE
SARS Coronavirus 2 by RT PCR: POSITIVE — AB

## 2021-12-07 LAB — D-DIMER, QUANTITATIVE: D-Dimer, Quant: 0.86 ug/mL-FEU — ABNORMAL HIGH (ref 0.00–0.50)

## 2021-12-07 LAB — PROCALCITONIN: Procalcitonin: <0.1 ng/mL

## 2021-12-07 LAB — LACTIC ACID, PLASMA: Lactic Acid, Venous: 0.9 mmol/L (ref 0.5–1.9)

## 2021-12-07 MED ORDER — METHYLPREDNISOLONE SODIUM SUCC 125 MG IJ SOLR
80.0000 mg | INTRAMUSCULAR | Status: DC
  Administered 2021-12-07: 80 mg via INTRAVENOUS
  Filled 2021-12-07: qty 2

## 2021-12-07 MED ORDER — ONDANSETRON 4 MG PO TBDP
4.0000 mg | ORAL_TABLET | Freq: Once | ORAL | Status: AC
  Administered 2021-12-07: 4 mg via ORAL
  Filled 2021-12-07: qty 1

## 2021-12-07 MED ORDER — NIRMATRELVIR/RITONAVIR (PAXLOVID)TABLET
3.0000 | ORAL_TABLET | Freq: Two times a day (BID) | ORAL | Status: DC
  Administered 2021-12-07 – 2021-12-08 (×3): 3 via ORAL
  Filled 2021-12-07: qty 30

## 2021-12-07 MED ORDER — ONDANSETRON HCL 4 MG/2ML IJ SOLN: 4.0000 mg | Freq: Three times a day (TID) | INTRAMUSCULAR | Status: DC | PRN

## 2021-12-07 MED ORDER — OXYCODONE HCL 5 MG PO TABS
5.0000 mg | ORAL_TABLET | Freq: Once | ORAL | Status: AC
  Administered 2021-12-07: 5 mg via ORAL
  Filled 2021-12-07: qty 1

## 2021-12-07 MED ORDER — ALBUTEROL SULFATE (2.5 MG/3ML) 0.083% IN NEBU: 3.0000 mL | INHALATION_SOLUTION | RESPIRATORY_TRACT | Status: DC | PRN

## 2021-12-07 MED ORDER — IPRATROPIUM-ALBUTEROL 0.5-2.5 (3) MG/3ML IN SOLN
3.0000 mL | Freq: Once | RESPIRATORY_TRACT | Status: AC
  Administered 2021-12-07: 3 mL via RESPIRATORY_TRACT
  Filled 2021-12-07: qty 3

## 2021-12-07 MED ORDER — DM-GUAIFENESIN ER 30-600 MG PO TB12: 1.0000 | ORAL_TABLET | Freq: Two times a day (BID) | ORAL | Status: DC | PRN

## 2021-12-07 MED ORDER — ALBUTEROL SULFATE HFA 108 (90 BASE) MCG/ACT IN AERS
2.0000 | INHALATION_SPRAY | Freq: Once | RESPIRATORY_TRACT | Status: DC
  Filled 2021-12-07: qty 6.7

## 2021-12-07 MED ORDER — SODIUM CHLORIDE 0.9 % IV BOLUS
1000.0000 mL | Freq: Once | INTRAVENOUS | Status: AC
  Administered 2021-12-07: 1000 mL via INTRAVENOUS

## 2021-12-07 MED ORDER — ENOXAPARIN SODIUM 40 MG/0.4ML IJ SOSY: 40.0000 mg | PREFILLED_SYRINGE | INTRAMUSCULAR | Status: DC

## 2021-12-07 MED ORDER — ACETAMINOPHEN 325 MG PO TABS
650.0000 mg | ORAL_TABLET | Freq: Once | ORAL | Status: AC
  Administered 2021-12-07: 650 mg via ORAL
  Filled 2021-12-07: qty 2

## 2021-12-07 MED ORDER — IOHEXOL 350 MG/ML SOLN
75.0000 mL | Freq: Once | INTRAVENOUS | Status: AC | PRN
  Administered 2021-12-07: 75 mL via INTRAVENOUS

## 2021-12-07 MED ORDER — ACETAMINOPHEN 325 MG PO TABS
650.0000 mg | ORAL_TABLET | Freq: Four times a day (QID) | ORAL | Status: DC | PRN
  Administered 2021-12-07: 650 mg via ORAL
  Filled 2021-12-07: qty 2

## 2021-12-07 NOTE — ED Notes (Signed)
Per MD Erma Heritage, pt was placed on 2L oxygen via [place] at this time. Pt sats recovered to 95%. Pt denying difficulty breathing or SHOB.

## 2021-12-07 NOTE — H&P (Signed)
History and Physical    Alicia Reese DGL:[id number] DOB: Apr 16, 1955 DOA: 12/07/2021  Referring MD/NP/PA:   PCP: Dorothey Baseman, MD   Patient coming from:  The patient is coming from home.  At baseline, pt is independent for most of ADL.        Chief Complaint: Fever, chills, cough, body aches,  HPI: Alicia Reese is a 66 y.o. female with medical history significant of arthritis, who presents with fever, chills, cough and bodyaches.    Patient states that she has dry cough for about 1 week, no shortness of breath or chest pain.  Since this morning, she developed fever, chills, headache, body aches, malaise, chest congestion, generalized weakness, poor appetite and decreased oral intake. No chest pain or SOB. Patient has nausea, no vomiting, diarrhea or abdominal pain.  No symptoms of UTI.   Data reviewed independently and ED Course: pt was found to have positive COVID PCR, positive D-dimer 0.86, WBC 6.5, lactic acid 0.9, GFR 60, temperature 101.1, blood pressure 118/7 3, heart rate 120-130, RR 20, oxygen saturation 89% on room air, which improved to 97% on 2 L oxygen.  Chest x-ray negative.  Lower extremity venous Doppler negative for DVT.  Patient is placed on telemetry bed for obs.  CTA: 1. No evidence for pulmonary embolism. 2. Mild patchy ground-glass opacities in the bilateral lower lobes, likely infectious/inflammatory. 3. Small hiatal hernia.  EKG: I have personally reviewed.  Sinus rhythm, QTc 449, precordial leads low voltage   Review of Systems:   General: Has fevers, chills, no body weight gain, has poor appetite, has fatigue, body aches HEENT: no blurry vision, hearing changes or sore throat Respiratory: no dyspnea, has coughing, no wheezing CV: no chest pain, no palpitations GI: no nausea, vomiting, abdominal pain, diarrhea, constipation GU: no dysuria, burning on urination, increased urinary frequency, hematuria  Ext: no leg edema Neuro: no unilateral weakness, numbness,  or tingling, no vision change or hearing loss Skin: no rash, no skin tear. MSK: No muscle spasm, no deformity, no limitation of range of movement in spin Heme: No easy bruising.  Travel history: No recent long distant travel.   Allergy:  Allergies  Allergen Reactions   Aspirin Anaphylaxis   Naproxen Anaphylaxis   Nsaids Anaphylaxis   Aspirin    Nsaids     Past Medical History:  Diagnosis Date   Arthritis    back, wrists, shoulders   Closed fracture of right proximal humerus     Past Surgical History:  Procedure Laterality Date   REVERSE SHOULDER ARTHROPLASTY Right 10/04/2019   Procedure: RIGHT REVERSE TOTAL  SHOULDER ARTHROPLASTY;  Surgeon: Bjorn Pippin, MD;  Location: [facility];  Service: Orthopedics;  Laterality: Right;   ROTATOR CUFF REPAIR Right 2019   TONSILLECTOMY      Social History:  reports that she has never smoked. She has never used smokeless tobacco. She reports that she does not drink alcohol and does not use drugs.  Family History:  Family History  Problem Relation Age of Onset   Bladder Cancer Sister    Colon cancer Sister      Prior to Admission medications   Medication Sig Start Date End Date Taking? Authorizing Provider  cyclobenzaprine (FLEXERIL) 5 MG tablet Take 1 tablet (5 mg total) by mouth 3 (three) times daily as needed. 09/25/19   Menshew, Charlesetta Ivory, PA-C  gabapentin (NEURONTIN) 100 MG capsule Take 1 capsule (100 mg total) by mouth 2 (two) times daily for 14  days. For pain. 10/04/19 10/18/19  McBane, Jerald Kief, PA-C  ondansetron (ZOFRAN-ODT) 8 MG disintegrating tablet Take 1 tablet (8 mg total) by mouth every 8 (eight) hours as needed for nausea or vomiting. 01/20/21   Merwyn Katos, MD  rivaroxaban (XARELTO) 10 MG TABS tablet Take 1 tablet (10 mg total) by mouth daily for 10 days. For DVT prophylaxis after surgery 10/04/19 10/14/19  Vernetta Honey, [place]    Physical Exam: Vitals:   12/07/21 0903 12/07/21 0906 12/07/21  1144 12/07/21 1158  BP:  118/73    Pulse:  (!) 120 (!) 106   Resp:  20 20   Temp:  (!) 101.1 F (38.4 C) 99.1 F (37.3 C)   TempSrc:  Oral    SpO2:  97% 92% 93%  Weight: 93 kg     Height: 5\' 7"  (1.702 m)      General: Not in acute distress HEENT:       Eyes: PERRL, EOMI, no scleral icterus.       ENT: No discharge from the ears and nose, no pharynx injection, no tonsillar enlargement.        Neck: No JVD, no bruit, no mass felt. Heme: No neck lymph node enlargement. Cardiac: S1/S2, RRR, No murmurs, No gallops or rubs. Respiratory: No rales, wheezing, rhonchi or rubs. GI: Soft, nondistended, nontender, no rebound pain, no organomegaly, BS present. GU: No hematuria Ext: No pitting leg edema bilaterally. 1+DP/PT pulse bilaterally. Musculoskeletal: No joint deformities, No joint redness or warmth, no limitation of ROM in spin. Skin: No rashes.  Neuro: Alert, oriented X3, cranial nerves II-XII grossly intact, moves all extremities normally.  Psych: Patient is not psychotic, no suicidal or hemocidal ideation.  Labs on Admission: I have personally reviewed following labs and imaging studies  CBC: Recent Labs  Lab 12/07/21 1325  WBC 6.5  NEUTROABS 5.5  HGB 12.1  HCT 34.6*  MCV 102.1*  PLT 207   Basic Metabolic Panel: Recent Labs  Lab 12/07/21 1325  NA 139  K 4.1  CL 109  CO2 23  GLUCOSE 114*  BUN 14  CREATININE 1.04*  CALCIUM 8.5*   GFR: Estimated Creatinine Clearance: 63.2 mL/min (A) (by C-G formula based on SCr of 1.04 mg/dL (H)). Liver Function Tests: Recent Labs  Lab 12/07/21 1325  AST 30  ALT 24  ALKPHOS 55  BILITOT 0.7  PROT 7.0  ALBUMIN 3.3*   No results for input(s): "LIPASE", "AMYLASE" in the last 168 hours. No results for input(s): "AMMONIA" in the last 168 hours. Coagulation Profile: No results for input(s): "INR", "PROTIME" in the last 168 hours. Cardiac Enzymes: No results for input(s): "CKTOTAL", "CKMB", "CKMBINDEX", "TROPONINI" in the  last 168 hours. BNP (last 3 results) No results for input(s): "PROBNP" in the last 8760 hours. HbA1C: No results for input(s): "HGBA1C" in the last 72 hours. CBG: No results for input(s): "GLUCAP" in the last 168 hours. Lipid Profile: No results for input(s): "CHOL", "HDL", "LDLCALC", "TRIG", "CHOLHDL", "LDLDIRECT" in the last 72 hours. Thyroid Function Tests: No results for input(s): "TSH", "T4TOTAL", "FREET4", "T3FREE", "THYROIDAB" in the last 72 hours. Anemia Panel: No results for input(s): "VITAMINB12", "FOLATE", "FERRITIN", "TIBC", "IRON", "RETICCTPCT" in the last 72 hours. Urine analysis:    Component Value Date/Time   COLORURINE YELLOW 01/20/2021 1036   APPEARANCEUR CLEAR 01/20/2021 1036   LABSPEC 1.025 01/20/2021 1036   PHURINE 5.5 01/20/2021 1036   GLUCOSEU NEGATIVE 01/20/2021 1036   HGBUR TRACE (A) 01/20/2021 1036  BILIRUBINUR LARGE (A) 01/20/2021 1036   KETONESUR 40 (A) 01/20/2021 1036   PROTEINUR 100 (A) 01/20/2021 1036   NITRITE POSITIVE (A) 01/20/2021 1036   LEUKOCYTESUR LARGE (A) 01/20/2021 1036   Sepsis Labs: (procalcitonin:4,lacticidven:4) ) Recent Results (from the past 240 hour(s))  Resp Panel by RT-PCR (Flu A&B, Covid) Anterior Nasal Swab     Status: Abnormal   Collection Time: 12/07/21  9:06 AM   Specimen: Anterior Nasal Swab  Result Value Ref Range Status   SARS Coronavirus 2 by RT PCR POSITIVE (A) NEGATIVE Final    Comment: (NOTE) SARS-CoV-2 target nucleic acids are DETECTED.  The SARS-CoV-2 RNA is generally detectable in upper respiratory specimens during the acute phase of infection. Positive results are indicative of the presence of the identified virus, but do not rule out bacterial infection or co-infection with other pathogens not detected by the test. Clinical correlation with patient history and other diagnostic information is necessary to determine patient infection status. The expected result is Negative.  Fact Sheet for  Patients: BloggerCourse.com  Fact Sheet for Healthcare Providers: SeriousBroker.it  This test is not yet approved or cleared by the [place] FDA and  has been authorized for detection and/or diagnosis of SARS-CoV-2 by FDA under an Emergency Use Authorization (EUA).  This EUA will remain in effect (meaning this test can be used) for the duration of  the COVID-19 declaration under Section 564(b)(1) of the A ct, 21 U.S.C. section 360bbb-3(b)(1), unless the authorization is terminated or revoked sooner.     Influenza A by PCR NEGATIVE NEGATIVE Final   Influenza B by PCR NEGATIVE NEGATIVE Final    Comment: (NOTE) The Xpert Xpress SARS-CoV-2/FLU/RSV plus assay is intended as an aid in the diagnosis of influenza from Nasopharyngeal swab specimens and should not be used as a sole basis for treatment. Nasal washings and aspirates are unacceptable for Xpert Xpress SARS-CoV-2/FLU/RSV testing.  Fact Sheet for Patients: BloggerCourse.com  Fact Sheet for Healthcare Providers: SeriousBroker.it  This test is not yet approved or cleared by the [place] FDA and has been authorized for detection and/or diagnosis of SARS-CoV-2 by FDA under an Emergency Use Authorization (EUA). This EUA will remain in effect (meaning this test can be used) for the duration of the COVID-19 declaration under Section 564(b)(1) of the Act, 21 U.S.C. section 360bbb-3(b)(1), unless the authorization is terminated or revoked.  Performed at [facility], [address] [place], [place] [address]      Radiological Exams on Admission: US Venous Img Lower Bilateral (DVT)  Result Date: 12/07/2021 CLINICAL DATA:  Positive D-dimer EXAM: BILATERAL LOWER EXTREMITY VENOUS DOPPLER ULTRASOUND TECHNIQUE: Gray-scale sonography with graded compression, as well as color Doppler and duplex ultrasound were performed  to evaluate the lower extremity deep venous systems from the level of the common femoral vein and including the common femoral, femoral, profunda femoral, popliteal and calf veins including the posterior tibial, peroneal and gastrocnemius veins when visible. The superficial great saphenous vein was also interrogated. Spectral Doppler was utilized to evaluate flow at rest and with distal augmentation maneuvers in the common femoral, femoral and popliteal veins. COMPARISON:  None Available. FINDINGS: RIGHT LOWER EXTREMITY Common Femoral Vein: No evidence of thrombus. Normal compressibility, respiratory phasicity and response to augmentation. Saphenofemoral Junction: No evidence of thrombus. Normal compressibility and flow on color Doppler imaging. Profunda Femoral Vein: No evidence of thrombus. Normal compressibility and flow on color Doppler imaging. Femoral Vein: No evidence of thrombus. Normal compressibility, respiratory phasicity and response  to augmentation. Popliteal Vein: No evidence of thrombus. Normal compressibility, respiratory phasicity and response to augmentation. Calf Veins: No evidence of thrombus. Normal compressibility and flow on color Doppler imaging. Superficial Great Saphenous Vein: No evidence of thrombus. Normal compressibility. Venous Reflux:  None. Other Findings:  None. LEFT LOWER EXTREMITY Common Femoral Vein: No evidence of thrombus. Normal compressibility, respiratory phasicity and response to augmentation. Saphenofemoral Junction: No evidence of thrombus. Normal compressibility and flow on color Doppler imaging. Profunda Femoral Vein: No evidence of thrombus. Normal compressibility and flow on color Doppler imaging. Femoral Vein: No evidence of thrombus. Normal compressibility, respiratory phasicity and response to augmentation. Popliteal Vein: No evidence of thrombus. Normal compressibility, respiratory phasicity and response to augmentation. Calf Veins: No evidence of thrombus. Normal  compressibility and flow on color Doppler imaging. Superficial Great Saphenous Vein: No evidence of thrombus. Normal compressibility. Venous Reflux:  None. Other Findings:  None. IMPRESSION: No evidence of deep venous thrombosis in either lower extremity. Electronically Signed   By: Duanne Guess D.O.   On: 12/07/2021 17:04   CT Angio Chest Pulmonary Embolism (PE) W or WO Contrast  Result Date: 12/07/2021 CLINICAL DATA:  Hypoxia and cough. EXAM: CT ANGIOGRAPHY CHEST WITH CONTRAST TECHNIQUE: Multidetector CT imaging of the chest was performed using the standard protocol during bolus administration of intravenous contrast. Multiplanar CT image reconstructions and MIPs were obtained to evaluate the vascular anatomy. RADIATION DOSE REDUCTION: This exam was performed according to the departmental dose-optimization program which includes automated exposure control, adjustment of the mA and/or kV according to patient size and/or use of iterative reconstruction technique. CONTRAST:  75mL OMNIPAQUE IOHEXOL 350 MG/ML SOLN COMPARISON:  None Available. FINDINGS: Cardiovascular: Satisfactory opacification of the pulmonary arteries to the segmental level. No evidence of pulmonary embolism. Normal heart size. No pericardial effusion. Mediastinum/Nodes: No enlarged mediastinal, hilar, or axillary lymph nodes. Thyroid gland, trachea, and esophagus demonstrate no significant findings. There is a small hiatal hernia. Lungs/Pleura: There is some mild patchy ground-glass opacities in the bilateral lower lobes. There is no focal lung consolidation, pleural effusion or pneumothorax. Upper Abdomen: No acute abnormality. Musculoskeletal: Degenerative changes affect the spine. Review of the MIP images confirms the above findings. IMPRESSION: 1. No evidence for pulmonary embolism. 2. Mild patchy ground-glass opacities in the bilateral lower lobes, likely infectious/inflammatory. 3. Small hiatal hernia. Electronically Signed   By: Darliss Cheney M.D.   On: 12/07/2021 16:37   DG Chest Portable 1 View  Result Date: 12/07/2021 CLINICAL DATA:  COVID, cough, fever. EXAM: PORTABLE CHEST 1 VIEW COMPARISON:  None Available. FINDINGS: The mediastinal contour is normal. The heart size is upper limits are normal. Both lungs are clear. Right shoulder replacement is noted. IMPRESSION: No active disease. Electronically Signed   By: Sherian Rein M.D.   On: 12/07/2021 10:56      Assessment/Plan Principal Problem:   Acute respiratory disease due to COVID-19 virus Active Problems:   Sepsis (HCC)   Assessment and Plan:  Acute respiratory disease due to COVID-19 virus and sepsis due to COVID infection: CAT negative for PE, but showed mild patchy ground-glass opacities in the bilateral lower lobes.  Patient has 2 L new oxygen requirement.  Patient meets criteria for sepsis with heart rate 120 and fever of 101.1.  Lactic acid is normal 0.9.  -place on tele bed for obs -Started Paxlovid -Bronchodilators and as needed Mucinex -Check CRP level -Follow-up of blood culture which is ordered by EDP -check procalcitonin level -IV fluid: 2 L  normal saline. -Nasal cannula oxygen to maintain oxygen saturation above 93%        DVT ppx: SQ Lovenox  Code Status: Full code  Family Communication:  Yes, patient's  daughter  at bed side.      Disposition Plan:  Anticipate discharge back to previous environment  Consults called: None  Admission status and Level of care: Telemetry Medical:   for obs    Dispo: The patient is from: Home              Anticipated d/c is to: Home              Anticipated d/c date is: 1 day              Patient currently is not medically stable to d/c.    Severity of Illness:  The appropriate patient status for this patient is OBSERVATION. Observation status is judged to be reasonable and necessary in order to provide the required intensity of service to ensure the patient's safety. The patient's  presenting symptoms, physical exam findings, and initial radiographic and laboratory data in the context of their medical condition is felt to place them at decreased risk for further clinical deterioration. Furthermore, it is anticipated that the patient will be medically stable for discharge from the hospital within 2 midnights of admission.        Date of Service 12/07/2021    Lorretta HarpXilin Jamerion Cabello [facility]   If 7PM-7AM, please contact night-coverage [email or web address] 12/07/2021, 5:56 PM

## 2021-12-07 NOTE — ED Notes (Signed)
Pt ambulated with pulse ox. Pt sats began at 123 HR and 95%. Pt sats maintained 93-95% while ambulating. HR went from 120s to 130s. Maintained 130s while walking. Pt denies SHOB, lightheadedness. Pt stating she is "burning up" and sweating. Pt does feel warm. This tech will take pt oral temp.

## 2021-12-07 NOTE — ED Triage Notes (Signed)
Pt reports started with a cough and bodyaches and now has fever and congestion as well. Pt denies exposures to illnesses.

## 2021-12-07 NOTE — ED Notes (Signed)
X-ray at bedside

## 2021-12-07 NOTE — ED Provider Notes (Signed)
[facility] Provider Note    Event Date/Time   First MD Initiated Contact with Patient 12/07/21 1022     (approximate)   History   Cough, Fever, Generalized Body Aches, and Nasal Congestion   HPI  Alicia Reese is a 66 y.o. female  here with cough, body aches, chills. Pt states that she has had progressively worsening cough, fatigue, chills for the last 1 week. Over the past 24 hr her sx have severely worsened and she now has body aches, chills, and fatigue. She has had decreased appetite, and poor PO intake. She has had nausea, loss of taste. No diarrhea. No known sick contacts. No h/o lung problems.       Physical Exam   Triage Vital Signs: ED Triage Vitals  Enc Vitals Group     BP 12/07/21 0906 118/73     Pulse Rate 12/07/21 0906 (!) 120     Resp 12/07/21 0906 20     Temp 12/07/21 0906 (!) 101.1 F (38.4 C)     Temp Source 12/07/21 0906 Oral     SpO2 12/07/21 0906 97 %     Weight 12/07/21 0903 205 lb 0.4 oz (93 kg)     Height 12/07/21 0903 5\' 7"  (1.702 m)     Head Circumference --      Peak Flow --      Pain Score 12/07/21 0903 9     Pain Loc --      Pain Edu? --      Excl. in [place]? --     Most recent vital signs: Vitals:   12/07/21 1144 12/07/21 1158  BP:    Pulse: (!) 106   Resp: 20   Temp: 99.1 F (37.3 C)   SpO2: 92% 93%     General: Awake, no distress.  CV:  Good peripheral perfusion. RRR. No murmurs, rubs, or gallops. Resp:  Mild tachypnea noted with bibasilar rales. Speaking in full sentences. Abd:  No distention.  Other:  Dry MM. Appears to feel unwell but is AOx4 and non-toxic.   ED Results / Procedures / Treatments   Labs (all labs ordered are listed, but only abnormal results are displayed) Labs Reviewed  RESP PANEL BY RT-PCR (FLU A&B, COVID) ARPGX2 - Abnormal; Notable for the following components:      Result Value   SARS Coronavirus 2 by RT PCR POSITIVE (*)    All other components within normal limits  CBC WITH  DIFFERENTIAL/PLATELET - Abnormal; Notable for the following components:   RBC 3.39 (*)    HCT 34.6 (*)    MCV 102.1 (*)    MCH 35.7 (*)    Lymphs Abs 0.4 (*)    All other components within normal limits  COMPREHENSIVE METABOLIC PANEL - Abnormal; Notable for the following components:   Glucose, Bld 114 (*)    Creatinine, Ser 1.04 (*)    Calcium 8.5 (*)    Albumin 3.3 (*)    GFR, Estimated 60 (*)    All other components within normal limits  D-DIMER, QUANTITATIVE - Abnormal; Notable for the following components:   D-Dimer, Quant 0.86 (*)    All other components within normal limits  CULTURE, BLOOD (SINGLE)  PROCALCITONIN  LACTIC ACID, PLASMA  CBC  C-REACTIVE PROTEIN  HIV ANTIBODY (ROUTINE TESTING W REFLEX)     EKG   Normal sinus rhythm, VR 71. PR 178, QRS 80, QTc 449. No acute ST elevations or depression. No ischemia or  infarct.    RADIOLOGY CXR: No active disease   I also independently reviewed and agree with radiologist interpretations.   PROCEDURES:  Critical Care performed: No  .1-3 Lead EKG Interpretation  Performed by: Shaune Pollack, MD Authorized by: Shaune Pollack, MD     Interpretation: abnormal     ECG rate:  100-110   ECG rate assessment: tachycardic     Rhythm: sinus tachycardia     Ectopy: none     Conduction: normal   Comments:     Indication: cough   MEDICATIONS ORDERED IN ED: Medications  albuterol (VENTOLIN HFA) 108 (90 Base) MCG/ACT inhaler 2 puff (0 puffs Inhalation Hold 12/07/21 1410)  albuterol (PROVENTIL) (2.5 MG/3ML) 0.083% nebulizer solution 3 mL (has no administration in time range)  dextromethorphan-guaiFENesin (MUCINEX DM) 30-600 MG per 12 hr tablet 1 tablet (has no administration in time range)  ondansetron (ZOFRAN) injection 4 mg (has no administration in time range)  acetaminophen (TYLENOL) tablet 650 mg (650 mg Oral Given 12/07/21 1538)  nirmatrelvir/ritonavir EUA (PAXLOVID) 3 tablet (3 tablets Oral Given 12/07/21 1539)   enoxaparin (LOVENOX) injection 40 mg (40 mg Subcutaneous Not Given 12/07/21 1547)  methylPREDNISolone sodium succinate (SOLU-MEDROL) 125 mg/2 mL injection 80 mg (80 mg Intravenous Given 12/07/21 1543)  acetaminophen (TYLENOL) tablet 650 mg (650 mg Oral Given by Other 12/07/21 1020)  ondansetron (ZOFRAN-ODT) disintegrating tablet 4 mg (4 mg Oral Given 12/07/21 1046)  oxyCODONE (Oxy IR/ROXICODONE) immediate release tablet 5 mg (5 mg Oral Given 12/07/21 1046)  ipratropium-albuterol (DUONEB) 0.5-2.5 (3) MG/3ML nebulizer solution 3 mL (3 mLs Nebulization Given 12/07/21 1409)  sodium chloride 0.9 % bolus 1,000 mL (0 mLs Intravenous Stopped 12/07/21 1504)  sodium chloride 0.9 % bolus 1,000 mL (1,000 mLs Intravenous New Bag/Given 12/07/21 1541)  iohexol (OMNIPAQUE) 350 MG/ML injection 75 mL (75 mLs Intravenous Contrast Given 12/07/21 1603)     IMPRESSION / MDM / ASSESSMENT AND PLAN / ED COURSE  I reviewed the triage vital signs and the nursing notes.                              Differential diagnosis includes, but is not limited to, COVID-19, URI, PNA, anemia, bronchospasm, less likely PE, CHF, ACS.  Patient's presentation is most consistent with acute presentation with potential threat to life or bodily function.   The patient is on the cardiac monitor to evaluate for evidence of arrhythmia and/or significant heart rate changes.   66 yo F here with cough, SOB. Pt arrives tachycardic, febrile, and ill appearing though mentating well. WBC normal. CMP with mild dehydraiton but o/w unremarkable. Procal negative. LA normal. COVID+. CXR clear. D-Dimer positive so CT Angio ordered. Pt hypoxic on RA after ambulation. Will admit for management of acute hypoxic resp failure 2/2 COVID-19.     FINAL CLINICAL IMPRESSION(S) / ED DIAGNOSES   Final diagnoses:  COVID-19     Rx / DC Orders   ED Discharge Orders     None        Note:  This document was prepared using Dragon voice recognition software  and may include unintentional dictation errors.   Shaune Pollack, MD 12/07/21 2201

## 2021-12-07 NOTE — ED Notes (Signed)
Pt in room at this time, sitting upright in the bed. Pt on RA at this time, satting at 89-low 90s Sp02. Pt still denying SHOB or difficulty breathing. MD aware.

## 2021-12-08 ENCOUNTER — Encounter: Payer: Self-pay | Admitting: Internal Medicine

## 2021-12-08 DIAGNOSIS — J069 Acute upper respiratory infection, unspecified: Secondary | ICD-10-CM | POA: Diagnosis not present

## 2021-12-08 DIAGNOSIS — U071 COVID-19: Secondary | ICD-10-CM | POA: Diagnosis not present

## 2021-12-08 LAB — CBC
HCT: 30.9 % — ABNORMAL LOW (ref 36.0–46.0)
Hemoglobin: 12.3 g/dL (ref 12.0–15.0)
MCH: 41.1 pg — ABNORMAL HIGH (ref 26.0–34.0)
MCHC: 39.8 g/dL — ABNORMAL HIGH (ref 30.0–36.0)
MCV: 103.3 fL — ABNORMAL HIGH (ref 80.0–100.0)
Platelets: 205 10*3/uL (ref 150–400)
RBC: 2.99 MIL/uL — ABNORMAL LOW (ref 3.87–5.11)
RDW: 14.2 % (ref 11.5–15.5)
WBC: 3.9 10*3/uL — ABNORMAL LOW (ref 4.0–10.5)
nRBC: 0 % (ref 0.0–0.2)

## 2021-12-08 MED ORDER — NIRMATRELVIR/RITONAVIR (PAXLOVID)TABLET: 3.0000 | ORAL_TABLET | Freq: Two times a day (BID) | ORAL | 0 refills | Status: AC

## 2021-12-08 MED ORDER — ALBUTEROL SULFATE HFA 108 (90 BASE) MCG/ACT IN AERS: 2.0000 | INHALATION_SPRAY | Freq: Once | RESPIRATORY_TRACT | 0 refills | Status: AC

## 2021-12-08 MED ORDER — DM-GUAIFENESIN ER 30-600 MG PO TB12: 1.0000 | ORAL_TABLET | Freq: Two times a day (BID) | ORAL | 0 refills | Status: AC | PRN

## 2021-12-08 NOTE — ED Notes (Signed)
Pt on room air, pt saturation 98% while ambulating on room air

## 2021-12-08 NOTE — ED Notes (Signed)
E-signature not working at this time. Pt verbalized understanding of D/C instructions, prescriptions and follow up care with no further questions at this time. Pt in NAD and ambulatory at time of D/C.  

## 2021-12-12 LAB — CULTURE, BLOOD (SINGLE)
Culture: NO GROWTH
Special Requests: ADEQUATE

## 2021-12-23 DIAGNOSIS — H5213 Myopia, bilateral: Secondary | ICD-10-CM | POA: Diagnosis not present

## 2021-12-23 DIAGNOSIS — H524 Presbyopia: Secondary | ICD-10-CM | POA: Diagnosis not present

## 2021-12-23 DIAGNOSIS — H40013 Open angle with borderline findings, low risk, bilateral: Secondary | ICD-10-CM | POA: Diagnosis not present

## 2021-12-23 DIAGNOSIS — H2513 Age-related nuclear cataract, bilateral: Secondary | ICD-10-CM | POA: Diagnosis not present

## 2021-12-23 DIAGNOSIS — H52223 Regular astigmatism, bilateral: Secondary | ICD-10-CM | POA: Diagnosis not present

## 2022-01-06 ENCOUNTER — Ambulatory Visit
Admission: RE | Admit: 2022-01-06 | Discharge: 2022-01-06 | Disposition: A | Discharge status: Home / Self Care | Payer: Medicare HMO | Source: Ambulatory Visit | Attending: Family Medicine | Admitting: Family Medicine

## 2022-01-06 DIAGNOSIS — Z1231 Encounter for screening mammogram for malignant neoplasm of breast: Secondary | ICD-10-CM | POA: Insufficient documentation

## 2022-01-16 DIAGNOSIS — H2513 Age-related nuclear cataract, bilateral: Secondary | ICD-10-CM | POA: Diagnosis not present

## 2022-01-16 DIAGNOSIS — H40013 Open angle with borderline findings, low risk, bilateral: Secondary | ICD-10-CM | POA: Diagnosis not present

## 2022-01-16 DIAGNOSIS — H2511 Age-related nuclear cataract, right eye: Secondary | ICD-10-CM | POA: Diagnosis not present

## 2022-03-05 DIAGNOSIS — H2511 Age-related nuclear cataract, right eye: Secondary | ICD-10-CM | POA: Diagnosis not present

## 2022-03-05 DIAGNOSIS — H25011 Cortical age-related cataract, right eye: Secondary | ICD-10-CM | POA: Diagnosis not present

## 2022-03-06 DIAGNOSIS — H25012 Cortical age-related cataract, left eye: Secondary | ICD-10-CM | POA: Diagnosis not present

## 2022-03-06 DIAGNOSIS — H2512 Age-related nuclear cataract, left eye: Secondary | ICD-10-CM | POA: Diagnosis not present

## 2022-03-06 DIAGNOSIS — H25042 Posterior subcapsular polar age-related cataract, left eye: Secondary | ICD-10-CM | POA: Diagnosis not present

## 2022-03-26 DIAGNOSIS — H2512 Age-related nuclear cataract, left eye: Secondary | ICD-10-CM | POA: Diagnosis not present

## 2022-03-26 DIAGNOSIS — H25012 Cortical age-related cataract, left eye: Secondary | ICD-10-CM | POA: Diagnosis not present

## 2022-03-26 DIAGNOSIS — H25042 Posterior subcapsular polar age-related cataract, left eye: Secondary | ICD-10-CM | POA: Diagnosis not present

## 2022-06-03 DIAGNOSIS — Z124 Encounter for screening for malignant neoplasm of cervix: Secondary | ICD-10-CM | POA: Diagnosis not present

## 2022-06-03 DIAGNOSIS — Z13220 Encounter for screening for lipoid disorders: Secondary | ICD-10-CM | POA: Diagnosis not present

## 2022-06-03 DIAGNOSIS — Z1211 Encounter for screening for malignant neoplasm of colon: Secondary | ICD-10-CM | POA: Diagnosis not present

## 2022-06-03 DIAGNOSIS — Z13820 Encounter for screening for osteoporosis: Secondary | ICD-10-CM | POA: Diagnosis not present

## 2022-06-03 DIAGNOSIS — Z131 Encounter for screening for diabetes mellitus: Secondary | ICD-10-CM | POA: Diagnosis not present

## 2022-06-03 DIAGNOSIS — Z0000 Encounter for general adult medical examination without abnormal findings: Secondary | ICD-10-CM | POA: Diagnosis not present

## 2022-06-16 DIAGNOSIS — Z1211 Encounter for screening for malignant neoplasm of colon: Secondary | ICD-10-CM | POA: Diagnosis not present

## 2022-07-07 DIAGNOSIS — M8588 Other specified disorders of bone density and structure, other site: Secondary | ICD-10-CM | POA: Diagnosis not present

## 2022-12-28 DIAGNOSIS — H00014 Hordeolum externum left upper eyelid: Secondary | ICD-10-CM | POA: Diagnosis not present

## 2022-12-28 DIAGNOSIS — J019 Acute sinusitis, unspecified: Secondary | ICD-10-CM | POA: Diagnosis not present
